# Patient Record
Sex: Male | Born: 1940 | Race: White | Hispanic: No | Marital: Married | Smoking: Never smoker
Health system: Southern US, Community
[De-identification: ages and names within clinical notes are randomized; demographics above are authoritative.]

## PROBLEM LIST (undated history)

## (undated) DIAGNOSIS — I499 Cardiac arrhythmia, unspecified: Secondary | ICD-10-CM

## (undated) DIAGNOSIS — N529 Male erectile dysfunction, unspecified: Secondary | ICD-10-CM

## (undated) DIAGNOSIS — I4891 Unspecified atrial fibrillation: Secondary | ICD-10-CM

## (undated) DIAGNOSIS — M169 Osteoarthritis of hip, unspecified: Secondary | ICD-10-CM

## (undated) DIAGNOSIS — I10 Essential (primary) hypertension: Secondary | ICD-10-CM

## (undated) HISTORY — PX: OTHER SURGICAL HISTORY: SHX169

## (undated) HISTORY — DX: Essential (primary) hypertension: I10

## (undated) HISTORY — DX: Unspecified atrial fibrillation: I48.91

## (undated) HISTORY — DX: Osteoarthritis of hip, unspecified: M16.9

## (undated) HISTORY — PX: VASECTOMY: SHX75

## (undated) HISTORY — PX: KNEE SURGERY: SHX244

## (undated) HISTORY — DX: Male erectile dysfunction, unspecified: N52.9

---

## 2004-04-16 HISTORY — PX: COLONOSCOPY: SHX174

## 2011-04-23 DIAGNOSIS — H5210 Myopia, unspecified eye: Secondary | ICD-10-CM | POA: Diagnosis not present

## 2011-04-23 DIAGNOSIS — H25049 Posterior subcapsular polar age-related cataract, unspecified eye: Secondary | ICD-10-CM | POA: Diagnosis not present

## 2011-08-28 DIAGNOSIS — Z1211 Encounter for screening for malignant neoplasm of colon: Secondary | ICD-10-CM | POA: Diagnosis not present

## 2011-08-28 DIAGNOSIS — M25559 Pain in unspecified hip: Secondary | ICD-10-CM | POA: Diagnosis not present

## 2011-08-28 DIAGNOSIS — I10 Essential (primary) hypertension: Secondary | ICD-10-CM | POA: Diagnosis not present

## 2011-08-28 DIAGNOSIS — Z79899 Other long term (current) drug therapy: Secondary | ICD-10-CM | POA: Diagnosis not present

## 2011-08-31 DIAGNOSIS — Z1211 Encounter for screening for malignant neoplasm of colon: Secondary | ICD-10-CM | POA: Diagnosis not present

## 2011-09-20 DIAGNOSIS — M169 Osteoarthritis of hip, unspecified: Secondary | ICD-10-CM | POA: Diagnosis not present

## 2012-04-22 DIAGNOSIS — J40 Bronchitis, not specified as acute or chronic: Secondary | ICD-10-CM | POA: Diagnosis not present

## 2012-06-09 DIAGNOSIS — H25019 Cortical age-related cataract, unspecified eye: Secondary | ICD-10-CM | POA: Diagnosis not present

## 2012-08-27 DIAGNOSIS — Z23 Encounter for immunization: Secondary | ICD-10-CM | POA: Diagnosis not present

## 2012-08-27 DIAGNOSIS — Z1211 Encounter for screening for malignant neoplasm of colon: Secondary | ICD-10-CM | POA: Diagnosis not present

## 2012-08-27 DIAGNOSIS — I10 Essential (primary) hypertension: Secondary | ICD-10-CM | POA: Diagnosis not present

## 2012-08-27 DIAGNOSIS — M169 Osteoarthritis of hip, unspecified: Secondary | ICD-10-CM | POA: Diagnosis not present

## 2012-08-27 DIAGNOSIS — Z79899 Other long term (current) drug therapy: Secondary | ICD-10-CM | POA: Diagnosis not present

## 2012-10-09 DIAGNOSIS — Z1211 Encounter for screening for malignant neoplasm of colon: Secondary | ICD-10-CM | POA: Diagnosis not present

## 2012-12-09 DIAGNOSIS — L819 Disorder of pigmentation, unspecified: Secondary | ICD-10-CM | POA: Diagnosis not present

## 2012-12-09 DIAGNOSIS — Z85828 Personal history of other malignant neoplasm of skin: Secondary | ICD-10-CM | POA: Diagnosis not present

## 2012-12-09 DIAGNOSIS — L821 Other seborrheic keratosis: Secondary | ICD-10-CM | POA: Diagnosis not present

## 2012-12-09 DIAGNOSIS — D485 Neoplasm of uncertain behavior of skin: Secondary | ICD-10-CM | POA: Diagnosis not present

## 2012-12-09 DIAGNOSIS — D239 Other benign neoplasm of skin, unspecified: Secondary | ICD-10-CM | POA: Diagnosis not present

## 2012-12-09 DIAGNOSIS — D1801 Hemangioma of skin and subcutaneous tissue: Secondary | ICD-10-CM | POA: Diagnosis not present

## 2012-12-10 DIAGNOSIS — L570 Actinic keratosis: Secondary | ICD-10-CM | POA: Diagnosis not present

## 2013-01-06 DIAGNOSIS — L570 Actinic keratosis: Secondary | ICD-10-CM | POA: Diagnosis not present

## 2013-04-13 DIAGNOSIS — H1130 Conjunctival hemorrhage, unspecified eye: Secondary | ICD-10-CM | POA: Diagnosis not present

## 2013-06-16 DIAGNOSIS — H2510 Age-related nuclear cataract, unspecified eye: Secondary | ICD-10-CM | POA: Diagnosis not present

## 2013-06-16 DIAGNOSIS — H25049 Posterior subcapsular polar age-related cataract, unspecified eye: Secondary | ICD-10-CM | POA: Diagnosis not present

## 2013-06-16 DIAGNOSIS — H5210 Myopia, unspecified eye: Secondary | ICD-10-CM | POA: Diagnosis not present

## 2013-08-27 DIAGNOSIS — Z1211 Encounter for screening for malignant neoplasm of colon: Secondary | ICD-10-CM | POA: Diagnosis not present

## 2013-08-27 DIAGNOSIS — M1610 Unilateral primary osteoarthritis, unspecified hip: Secondary | ICD-10-CM | POA: Diagnosis not present

## 2013-08-27 DIAGNOSIS — M169 Osteoarthritis of hip, unspecified: Secondary | ICD-10-CM | POA: Diagnosis not present

## 2013-08-27 DIAGNOSIS — I10 Essential (primary) hypertension: Secondary | ICD-10-CM | POA: Diagnosis not present

## 2013-09-15 DIAGNOSIS — Z1211 Encounter for screening for malignant neoplasm of colon: Secondary | ICD-10-CM | POA: Diagnosis not present

## 2013-10-28 DIAGNOSIS — R300 Dysuria: Secondary | ICD-10-CM | POA: Diagnosis not present

## 2013-10-28 DIAGNOSIS — R319 Hematuria, unspecified: Secondary | ICD-10-CM | POA: Diagnosis not present

## 2013-10-28 DIAGNOSIS — N419 Inflammatory disease of prostate, unspecified: Secondary | ICD-10-CM | POA: Diagnosis not present

## 2013-11-05 DIAGNOSIS — M7750 Other enthesopathy of unspecified foot: Secondary | ICD-10-CM | POA: Diagnosis not present

## 2013-11-05 DIAGNOSIS — M1710 Unilateral primary osteoarthritis, unspecified knee: Secondary | ICD-10-CM | POA: Diagnosis not present

## 2013-11-06 DIAGNOSIS — M25579 Pain in unspecified ankle and joints of unspecified foot: Secondary | ICD-10-CM | POA: Diagnosis not present

## 2013-11-16 DIAGNOSIS — M25579 Pain in unspecified ankle and joints of unspecified foot: Secondary | ICD-10-CM | POA: Diagnosis not present

## 2013-11-19 DIAGNOSIS — M25579 Pain in unspecified ankle and joints of unspecified foot: Secondary | ICD-10-CM | POA: Diagnosis not present

## 2013-11-30 DIAGNOSIS — M25579 Pain in unspecified ankle and joints of unspecified foot: Secondary | ICD-10-CM | POA: Diagnosis not present

## 2013-12-11 DIAGNOSIS — M25579 Pain in unspecified ankle and joints of unspecified foot: Secondary | ICD-10-CM | POA: Diagnosis not present

## 2013-12-14 DIAGNOSIS — M25579 Pain in unspecified ankle and joints of unspecified foot: Secondary | ICD-10-CM | POA: Diagnosis not present

## 2013-12-17 DIAGNOSIS — M25579 Pain in unspecified ankle and joints of unspecified foot: Secondary | ICD-10-CM | POA: Diagnosis not present

## 2013-12-18 DIAGNOSIS — Z85828 Personal history of other malignant neoplasm of skin: Secondary | ICD-10-CM | POA: Diagnosis not present

## 2013-12-18 DIAGNOSIS — D1801 Hemangioma of skin and subcutaneous tissue: Secondary | ICD-10-CM | POA: Diagnosis not present

## 2013-12-18 DIAGNOSIS — L819 Disorder of pigmentation, unspecified: Secondary | ICD-10-CM | POA: Diagnosis not present

## 2013-12-18 DIAGNOSIS — L570 Actinic keratosis: Secondary | ICD-10-CM | POA: Diagnosis not present

## 2013-12-18 DIAGNOSIS — L821 Other seborrheic keratosis: Secondary | ICD-10-CM | POA: Diagnosis not present

## 2013-12-18 DIAGNOSIS — L84 Corns and callosities: Secondary | ICD-10-CM | POA: Diagnosis not present

## 2013-12-18 DIAGNOSIS — D239 Other benign neoplasm of skin, unspecified: Secondary | ICD-10-CM | POA: Diagnosis not present

## 2013-12-22 DIAGNOSIS — M25579 Pain in unspecified ankle and joints of unspecified foot: Secondary | ICD-10-CM | POA: Diagnosis not present

## 2014-02-02 DIAGNOSIS — Z23 Encounter for immunization: Secondary | ICD-10-CM | POA: Diagnosis not present

## 2014-05-19 DIAGNOSIS — D1030 Benign neoplasm of unspecified part of mouth: Secondary | ICD-10-CM | POA: Diagnosis not present

## 2014-05-26 DIAGNOSIS — K1321 Leukoplakia of oral mucosa, including tongue: Secondary | ICD-10-CM | POA: Diagnosis not present

## 2014-06-21 DIAGNOSIS — H2513 Age-related nuclear cataract, bilateral: Secondary | ICD-10-CM | POA: Diagnosis not present

## 2014-06-21 DIAGNOSIS — H5213 Myopia, bilateral: Secondary | ICD-10-CM | POA: Diagnosis not present

## 2014-06-28 DIAGNOSIS — R3915 Urgency of urination: Secondary | ICD-10-CM | POA: Diagnosis not present

## 2014-07-06 DIAGNOSIS — R350 Frequency of micturition: Secondary | ICD-10-CM | POA: Diagnosis not present

## 2014-07-06 DIAGNOSIS — N419 Inflammatory disease of prostate, unspecified: Secondary | ICD-10-CM | POA: Diagnosis not present

## 2014-08-31 DIAGNOSIS — I10 Essential (primary) hypertension: Secondary | ICD-10-CM | POA: Diagnosis not present

## 2014-08-31 DIAGNOSIS — M169 Osteoarthritis of hip, unspecified: Secondary | ICD-10-CM | POA: Diagnosis not present

## 2014-08-31 DIAGNOSIS — N529 Male erectile dysfunction, unspecified: Secondary | ICD-10-CM | POA: Diagnosis not present

## 2014-08-31 DIAGNOSIS — Z23 Encounter for immunization: Secondary | ICD-10-CM | POA: Diagnosis not present

## 2014-08-31 DIAGNOSIS — Z1389 Encounter for screening for other disorder: Secondary | ICD-10-CM | POA: Diagnosis not present

## 2014-12-21 DIAGNOSIS — D1800 Hemangioma unspecified site: Secondary | ICD-10-CM | POA: Diagnosis not present

## 2014-12-21 DIAGNOSIS — L570 Actinic keratosis: Secondary | ICD-10-CM | POA: Diagnosis not present

## 2014-12-21 DIAGNOSIS — L814 Other melanin hyperpigmentation: Secondary | ICD-10-CM | POA: Diagnosis not present

## 2014-12-21 DIAGNOSIS — Z85828 Personal history of other malignant neoplasm of skin: Secondary | ICD-10-CM | POA: Diagnosis not present

## 2014-12-21 DIAGNOSIS — L821 Other seborrheic keratosis: Secondary | ICD-10-CM | POA: Diagnosis not present

## 2014-12-21 DIAGNOSIS — D225 Melanocytic nevi of trunk: Secondary | ICD-10-CM | POA: Diagnosis not present

## 2015-01-05 DIAGNOSIS — M1611 Unilateral primary osteoarthritis, right hip: Secondary | ICD-10-CM | POA: Diagnosis not present

## 2015-01-25 DIAGNOSIS — Z23 Encounter for immunization: Secondary | ICD-10-CM | POA: Diagnosis not present

## 2015-06-28 DIAGNOSIS — H25013 Cortical age-related cataract, bilateral: Secondary | ICD-10-CM | POA: Diagnosis not present

## 2015-06-28 DIAGNOSIS — H5213 Myopia, bilateral: Secondary | ICD-10-CM | POA: Diagnosis not present

## 2015-07-01 DIAGNOSIS — Z1211 Encounter for screening for malignant neoplasm of colon: Secondary | ICD-10-CM | POA: Diagnosis not present

## 2015-08-15 DIAGNOSIS — I4891 Unspecified atrial fibrillation: Secondary | ICD-10-CM | POA: Insufficient documentation

## 2015-08-15 HISTORY — DX: Unspecified atrial fibrillation: I48.91

## 2015-08-31 DIAGNOSIS — N529 Male erectile dysfunction, unspecified: Secondary | ICD-10-CM | POA: Diagnosis not present

## 2015-08-31 DIAGNOSIS — M169 Osteoarthritis of hip, unspecified: Secondary | ICD-10-CM | POA: Diagnosis not present

## 2015-08-31 DIAGNOSIS — I4891 Unspecified atrial fibrillation: Secondary | ICD-10-CM | POA: Diagnosis not present

## 2015-08-31 DIAGNOSIS — Z1389 Encounter for screening for other disorder: Secondary | ICD-10-CM | POA: Diagnosis not present

## 2015-08-31 DIAGNOSIS — R9431 Abnormal electrocardiogram [ECG] [EKG]: Secondary | ICD-10-CM | POA: Diagnosis not present

## 2015-08-31 DIAGNOSIS — I10 Essential (primary) hypertension: Secondary | ICD-10-CM | POA: Diagnosis not present

## 2015-09-01 DIAGNOSIS — N529 Male erectile dysfunction, unspecified: Secondary | ICD-10-CM | POA: Insufficient documentation

## 2015-09-01 DIAGNOSIS — M169 Osteoarthritis of hip, unspecified: Secondary | ICD-10-CM | POA: Insufficient documentation

## 2015-09-08 ENCOUNTER — Encounter: Payer: Self-pay | Admitting: Cardiology

## 2015-09-13 ENCOUNTER — Ambulatory Visit (INDEPENDENT_AMBULATORY_CARE_PROVIDER_SITE_OTHER): Payer: Medicare Other | Admitting: Cardiology

## 2015-09-13 ENCOUNTER — Encounter: Payer: Self-pay | Admitting: Cardiology

## 2015-09-13 VITALS — BP 144/88 | HR 84 | Ht 74.0 in | Wt 202.2 lb

## 2015-09-13 DIAGNOSIS — I4891 Unspecified atrial fibrillation: Secondary | ICD-10-CM | POA: Diagnosis not present

## 2015-09-13 DIAGNOSIS — I481 Persistent atrial fibrillation: Secondary | ICD-10-CM | POA: Diagnosis not present

## 2015-09-13 DIAGNOSIS — I4819 Other persistent atrial fibrillation: Secondary | ICD-10-CM

## 2015-09-13 NOTE — Progress Notes (Signed)
Electrophysiology Office Note   Date:  09/13/2015   ID:  DAVINCI Roberson, DOB [birth year], MRN [id number]  PCP:  Simona Huh, MD  Primary Electrophysiologist:  Constance Haw, MD    Chief Complaint  Patient presents with  . Advice Only  . Atrial Fibrillation     History of Present Illness: Bradley Roberson is a 75 y.o. male who presents today for electrophysiology evaluation.   He has a history of atrial fibrillation and hypertension. He was recently diagnosed with atrial fibrillation. He was started on rivaroxaban by his primary care physician. He says that he was getting a colonoscopy in March when he was noted to be in atrial fibrillation.he was recently seen by his primary physician was put on rivaroxaban 2 weeks ago. He has no chest pain, shortness of breath, or fatigue. He says that he is able to exercise without any issues.   Today, he denies symptoms of palpitations, chest pain, shortness of breath, orthopnea, PND, lower extremity edema, claudication, dizziness, presyncope, syncope, bleeding, or neurologic sequela. The patient is tolerating medications without difficulties and is otherwise without complaint today.    Past Medical History  Diagnosis Date  . Hypertension   . 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 Erectile dysfunction   . Atrial fibrillation ([place]) 08/2015   Past Surgical History  Procedure Laterality Date  . Right anterior cruciate ligament tear    . Colonoscopy  2006  . Knee surgery      x 5  . Vasectomy       Current Outpatient Prescriptions  Medication Sig Dispense Refill  . meloxicam (MOBIC) 7.5 MG tablet Take 7.5 mg by mouth daily.    . quinapril (ACCUPRIL) 10 MG tablet Take 10 mg by mouth daily.    . rivaroxaban (XARELTO) 20 MG TABS tablet Take 20 mg by mouth daily with supper.    . sildenafil (REVATIO) 20 MG tablet Take 20 mg by mouth as needed (sexual activity).     No current facility-administered medications for  this visit.    Allergies:   Review of patient's allergies indicates no known allergies.   Social History:  The patient  reports that he has never smoked. He does not have any smokeless tobacco history on file. He reports that he drinks about 1.8 oz of alcohol per week. He reports that he does not use illicit drugs.   Family History:  The patient's family history includes Cirrhosis in his father; Hemochromatosis in his father; Hypertension in his mother; Liver cancer in his father.    ROS:  Please see the history of present illness.   Otherwise, review of systems is positive for palpitations.   All other systems are reviewed and negative.    PHYSICAL EXAM: VS:  BP 144/88 mmHg  Pulse 84  Ht 6\' 2"  (1.88 m)  Wt 202 lb 3.2 oz (91.717 kg)  BMI 25.95 kg/m2 , BMI Body mass index is 25.95 kg/(m^2). GEN: Well nourished, well developed, in no acute distress HEENT: normal Neck: no JVD, carotid bruits, or masses Cardiac: RRR; no murmurs, rubs, or gallops,no edema  Respiratory:  clear to auscultation bilaterally, normal work of breathing GI: soft, nontender, nondistended, + BS MS: no deformity or atrophy Skin: warm and dry Neuro:  Strength and sensation are intact Psych: euthymic mood, full affect  EKG:  EKG is ordered today. The ekg ordered today shows atrial fibrillation, LVH by voltage, QTc 463  Recent  Labs: No results found for requested labs within last 365 days.    Lipid Panel  No results found for: CHOL, TRIG, HDL, CHOLHDL, VLDL, LDLCALC, LDLDIRECT   Wt Readings from Last 3 Encounters:  09/13/15 202 lb 3.2 oz (91.717 kg)      Other studies Reviewed: Additional studies/ records that were reviewed today include: PCP notes   ASSESSMENT AND PLAN:  1.  Persistent atrial fibrillation: was placed on rivaroxaban by his primary physician. Feels well without any major symptoms. He has not had an echocardiogram, nor TSH, so we will order those today. I did discuss with him the  option of going back to normal rhythm. I discussed cardioversion, medications, and the potential for ablation down the road. We will get the TSH and the echocardiogram, and see him back in a month for further discussions on resumption of sinus rhythm.  This patients CHA2DS2-VASc Score and unadjusted Ischemic Stroke Rate (% per year) is equal to 2.2 % stroke rate/year from a score of 2  Above score calculated as 1 point each if present [CHF, HTN, DM, Vascular=MI/PAD/Aortic Plaque, Age if 65-74, or Male] Above score calculated as 2 points each if present [Age > 75, or Stroke/TIA/TE]  2. Hypertension: blood pressure today is mildly elevated. He will check his blood pressure at home and give [place] readings when he returns to clinic.  Current medicines are reviewed at length with the patient today.   The patient does not have concerns regarding his medicines.  The following changes were made today:  none  Labs/ tests ordered today include:  No orders of the defined types were placed in this encounter.     Disposition:   FU with Will Camnitz 1 month   Signed, Will Meredith Leeds, MD  09/13/2015 2:49 PM     [facility] [address] [place]  [address] [phone] (office) [phone] (fax)

## 2015-09-13 NOTE — Patient Instructions (Addendum)
Medication Instructions:  Your physician recommends that you continue on your current medications as directed. Please refer to the Current Medication list given to you today.  Labwork: TSH today  Testing/Procedures: Your physician has requested that you have an echocardiogram. Echocardiography is a painless test that uses sound waves to create images of your heart. It provides your doctor with information about the size and shape of your heart and how well your heart's chambers and valves are working. This procedure takes approximately one hour. There are no restrictions for this procedure.  Follow-Up: Your physician recommends that you schedule a follow-up appointment in: 1 month with Dr. Curt Bears  If you need a refill on your cardiac medications before your next appointment, please call your pharmacy.  Thank you for choosing [facility]!!   Trinidad Curet, RN [phone]  Any Other Special Instructions Will Be Listed Below (If Applicable). Please call the office if blood pressures are extremely high.  Otherwise keep track of your blood pressures and bring readings to next office visit.

## 2015-09-14 ENCOUNTER — Encounter: Payer: Self-pay | Admitting: Cardiology

## 2015-09-14 LAB — TSH: TSH: 1.05 m[IU]/L (ref 0.40–4.50)

## 2015-09-28 ENCOUNTER — Other Ambulatory Visit: Payer: Self-pay

## 2015-09-28 ENCOUNTER — Ambulatory Visit (HOSPITAL_COMMUNITY): Payer: Medicare Other | Attending: Cardiovascular Disease

## 2015-09-28 DIAGNOSIS — I481 Persistent atrial fibrillation: Secondary | ICD-10-CM | POA: Diagnosis not present

## 2015-09-28 DIAGNOSIS — I517 Cardiomegaly: Secondary | ICD-10-CM | POA: Diagnosis not present

## 2015-09-28 DIAGNOSIS — I351 Nonrheumatic aortic (valve) insufficiency: Secondary | ICD-10-CM | POA: Insufficient documentation

## 2015-09-28 DIAGNOSIS — I4891 Unspecified atrial fibrillation: Secondary | ICD-10-CM | POA: Diagnosis present

## 2015-09-28 DIAGNOSIS — I071 Rheumatic tricuspid insufficiency: Secondary | ICD-10-CM | POA: Diagnosis not present

## 2015-09-28 DIAGNOSIS — I340 Nonrheumatic mitral (valve) insufficiency: Secondary | ICD-10-CM | POA: Insufficient documentation

## 2015-09-28 DIAGNOSIS — I4819 Other persistent atrial fibrillation: Secondary | ICD-10-CM

## 2015-09-28 LAB — ECHOCARDIOGRAM COMPLETE
AOPV: 0.44 m/s
AV Area VTI index: 1.14 cm2/m2
AV Area VTI: 2.34 cm2
AV Area mean vel: 2.12 cm2
AV VEL mean LVOT/AV: 0.4
AV area mean vel ind: 0.96 cm2/m2
AV peak Index: 1.07
AVA: 2.5 cm2
AVG: 6 mmHg
AVPG: 11 mmHg
AVPKVEL: 166 cm/s
Ao-asc: 42 cm
CHL CUP AV VEL: 2.5
CHL CUP DOP CALC LVOT VTI: 14.8 cm
CHL CUP RV SYS PRESS: 34 mmHg
DOP CAL AO MEAN VELOCITY: 120 cm/s
FS: 20 % — AB (ref 28–44)
IV/PV OW: 1.2
LA ID, A-P, ES: 48 mm
LA diam end sys: 48 mm
LA diam index: 2.18 cm/m2
LAVOL: 169 mL
LAVOLA4C: 171 mL
LAVOLIN: 76.9 mL/m2
LVOT SV: 79 mL
LVOT area: 5.31 cm2
LVOT peak VTI: 0.47 cm
LVOT peak vel: 73.1 cm/s
LVOTD: 26 mm
PW: 9.32 mm — AB (ref 0.6–1.1)
Reg peak vel: 254 cm/s
TRMAXVEL: 254 cm/s
VTI: 31.4 cm
Valve area index: 1.14

## 2015-10-16 NOTE — Progress Notes (Signed)
Electrophysiology Office Note   Date:  10/17/2015   ID:  Bradley Roberson, DOB 01-10-1941, MRN [id number]  PCP:  Simona Huh, MD  Primary Electrophysiologist:  Constance Haw, MD    Chief Complaint  Patient presents with  . Follow-up    1 month  . PAF     History of Present Illness: Bradley Roberson is a 75 y.o. male who presents today for electrophysiology evaluation.   He has a history of atrial fibrillation and hypertension. He was recently diagnosed with atrial fibrillation. He was started on rivaroxaban by his primary care physician. He says that he was getting a colonoscopy in March when he was noted to be in atrial fibrillation.   Today, he denies symptoms of palpitations, chest pain, shortness of breath, orthopnea, PND, lower extremity edema, claudication, dizziness, presyncope, syncope, bleeding, or neurologic sequela. The patient is tolerating medications without difficulties and is otherwise without complaint today. He has not had any changes in exercise tolerance.   Past Medical History  Diagnosis Date  . Hypertension   . 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 Erectile dysfunction   . Atrial fibrillation ([place]) 08/2015   Past Surgical History  Procedure Laterality Date  . Right anterior cruciate ligament tear    . Colonoscopy  2006  . Knee surgery      x 5  . Vasectomy       Current Outpatient Prescriptions  Medication Sig Dispense Refill  . meloxicam (MOBIC) 7.5 MG tablet Take 7.5 mg by mouth daily.    . quinapril (ACCUPRIL) 10 MG tablet Take 10 mg by mouth daily.    . rivaroxaban (XARELTO) 20 MG TABS tablet Take 20 mg by mouth daily with supper.    . sildenafil (REVATIO) 20 MG tablet Take 20 mg by mouth as needed (sexual activity).     No current facility-administered medications for this visit.    Allergies:   Review of patient's allergies indicates no known allergies.   Social History:  The patient  reports that he has never  smoked. He does not have any smokeless tobacco history on file. He reports that he drinks about 1.8 oz of alcohol per week. He reports that he does not use illicit drugs.   Family History:  The patient's family history includes Cirrhosis in his father; Hemochromatosis in his father; Hypertension in his mother; Liver cancer in his father.    ROS:  Please see the history of present illness.   Otherwise, review of systems is positive for none.   All other systems are reviewed and negative.    PHYSICAL EXAM: VS:  BP 162/98 mmHg  Pulse 73  Ht 6\' 2"  (1.88 m)  Wt 201 lb 9.6 oz (91.445 kg)  BMI 25.87 kg/m2 , BMI Body mass index is 25.87 kg/(m^2). GEN: Well nourished, well developed, in no acute distress HEENT: normal Neck: no JVD, carotid bruits, or masses Cardiac: irregular; no murmurs, rubs, or gallops,no edema  Respiratory:  clear to auscultation bilaterally, normal work of breathing GI: soft, nontender, nondistended, + BS MS: no deformity or atrophy Skin: warm and dry Neuro:  Strength and sensation are intact Psych: euthymic mood, full affect  EKG:  EKG is ordered today. The ekg ordered today shows atrial fibrillation, LVH by voltage, QTc 434  Recent Labs: 09/13/2015: TSH 1.05    Lipid Panel  No results found for: CHOL, TRIG, HDL, CHOLHDL, VLDL, LDLCALC, LDLDIRECT   Wt  Readings from Last 3 Encounters:  10/17/15 201 lb 9.6 oz (91.445 kg)  09/13/15 202 lb 3.2 oz (91.717 kg)      Other studies Reviewed: Additional studies/ records that were reviewed today include: TTE 09/28/15 - Left ventricle: The cavity size was severely dilated. There was  mild focal basal hypertrophy of the septum. Systolic function was  mildly reduced. The estimated ejection fraction was in the range  of 45% to 50%. Diffuse hypokinesis. - Aortic valve: Trileaflet; moderately thickened, moderately  calcified leaflets. There was mild regurgitation. - Mitral valve: Calcified annulus. There was trivial  regurgitation. - Left atrium: The appendage was severely dilated. - Right ventricle: The cavity size was mildly dilated. Wall  thickness was normal. Systolic function was normal. - Right atrium: The appendage was severely dilated. - Atrial septum: No defect or patent foramen ovale was identified  by color flow Doppler. - Tricuspid valve: There was mild regurgitation. - Pulmonary arteries: Systolic pressure was within the normal  range. PA peak pressure: 34 mm Hg (S). - Inferior vena cava: The vessel was dilated. The respirophasic  diameter changes were in the normal range (>= 50%), consistent  with elevated central venous pressure.  Impressions:  - Systolic function may be underestimated due to atrial  fibrillation.   ASSESSMENT AND PLAN:  1.  Persistent atrial fibrillation: was placed on rivaroxaban by his primary physician.Currently, he does not have many symptoms from atrial fibrillation.  His antiarrhythmic options are limited due to dilated LV with mildly reduced LVEF. Options are amiodarone or Tikosyn.  Belinda Schlichting further discuss with the patient over the phone to find the best medical regimen for his AF.  May choose to remain in AF as currently asymptomatic at this time.  This patients CHA2DS2-VASc Score and unadjusted Ischemic Stroke Rate (% per year) is equal to 2.2 % stroke rate/year from a score of 2  Above score calculated as 1 point each if present [CHF, HTN, DM, Vascular=MI/PAD/Aortic Plaque, Age if 65-74, or Male] Above score calculated as 2 points each if present [Age > 75, or Stroke/TIA/TE]  2. Hypertension: blood pressure today is mildly elevated. He Katelinn Justice check his blood pressure at home and give [place] readings when he returns to clinic.  Current medicines are reviewed at length with the patient today.   The patient does not have concerns regarding his medicines.  The following changes were made today:  none  Labs/ tests ordered today include:  No orders of the  defined types were placed in this encounter.     Disposition:   FU with Quetzalli Clos 1 month   Signed, Ashlin Hidalgo Meredith Leeds, MD  10/17/2015 11:36 AM     [facility] [address] [place] [address] [phone] (office) [phone] (fax)

## 2015-10-17 ENCOUNTER — Ambulatory Visit (INDEPENDENT_AMBULATORY_CARE_PROVIDER_SITE_OTHER): Payer: Medicare Other | Admitting: Cardiology

## 2015-10-17 ENCOUNTER — Encounter: Payer: Self-pay | Admitting: Cardiology

## 2015-10-17 VITALS — BP 162/98 | HR 73 | Ht 74.0 in | Wt 201.6 lb

## 2015-10-17 DIAGNOSIS — I481 Persistent atrial fibrillation: Secondary | ICD-10-CM | POA: Diagnosis not present

## 2015-10-17 DIAGNOSIS — I4819 Other persistent atrial fibrillation: Secondary | ICD-10-CM

## 2015-10-17 NOTE — Patient Instructions (Signed)
Medication Instructions:  Your physician recommends that you continue on your current medications as directed. Please refer to the Current Medication list given to you today.  Labwork: None ordered  Testing/Procedures: None ordered  Follow-Up: We will call you with plan of care once Dr. Curt Bears has taken a closer look at echocardiogram results and your left atrium.  If you need a refill on your cardiac medications before your next appointment, please call your pharmacy.  Thank you for choosing [facility]!!   Trinidad Curet, RN [phone]

## 2015-10-21 ENCOUNTER — Telehealth: Payer: Self-pay

## 2015-10-21 NOTE — Telephone Encounter (Signed)
Letter received from [employer]. They have denied help with his Xarelto. Hopefully patient has received this same letter.

## 2015-10-24 ENCOUNTER — Telehealth: Payer: Self-pay | Admitting: *Deleted

## 2015-10-24 NOTE — Telephone Encounter (Signed)
Spoke with patient informing him of 3 choices he has for AFib:  1) Stay in AFib w/ rate control     2)  DCCV       3) Drugs  Patient would like OV with Dr. Curt Bears to discuss in more detail w/ pt and pt's wife. Appt made for 7/26. Patient thanks me for helping.

## 2015-11-08 NOTE — Progress Notes (Signed)
Electrophysiology Office Note   Date:  11/09/2015   ID:  LAYN PLETT, DOB 07-18-1940, MRN [id number]  PCP:  Simona Huh, MD  Primary Electrophysiologist:  Constance Haw, MD    Chief Complaint  Patient presents with  . Follow-up  . Atrial Fibrillation     History of Present Illness: Bradley Roberson is a 75 y.o. male who presents today for electrophysiology evaluation.   He has a history of atrial fibrillation and hypertension. He was recently diagnosed with atrial fibrillation. He was started on rivaroxaban by his primary care physician. He says that he was getting a colonoscopy in March when he was noted to be in atrial fibrillation.    Today, he denies symptoms of palpitations, chest pain, shortness of breath, orthopnea, PND, lower extremity edema, claudication, dizziness, presyncope, syncope, bleeding, or neurologic sequela. The patient is tolerating medications without difficulties and is otherwise without complaint today.  He does say that he has been napping more often, but is still able to exercise without issue.  Past Medical History:  Diagnosis Date  . Atrial fibrillation ([place]) 08/2015  . 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 Erectile dysfunction   . Hypertension    Past Surgical History:  Procedure Laterality Date  . COLONOSCOPY  2006  . KNEE SURGERY     x 5  . right anterior cruciate ligament tear    . VASECTOMY       Current Outpatient Prescriptions  Medication Sig Dispense Refill  . meloxicam (MOBIC) 7.5 MG tablet Take 7.5 mg by mouth daily.    . quinapril (ACCUPRIL) 10 MG tablet Take 10 mg by mouth daily.    . rivaroxaban (XARELTO) 20 MG TABS tablet Take 20 mg by mouth daily with supper.    . sildenafil (REVATIO) 20 MG tablet Take 20 mg by mouth as needed (sexual activity).     No current facility-administered medications for this visit.     Allergies:   Review of patient's allergies indicates no known allergies.    Social History:  The patient  reports that he has never smoked. He does not have any smokeless tobacco history on file. He reports that he drinks about 1.8 oz of alcohol per week . He reports that he does not use drugs.   Family History:  The patient's family history includes Cirrhosis in his father; Hemochromatosis in his father; Hypertension in his mother; Liver cancer in his father.    ROS:  Please see the history of present illness.   Otherwise, review of systems is positive for none.   All other systems are reviewed and negative.    PHYSICAL EXAM: VS:  BP (!) 166/110   Pulse 70   Ht 6\' 2"  (1.88 m)   Wt 199 lb 12.8 oz (90.6 kg)   BMI 25.65 kg/m  , BMI Body mass index is 25.65 kg/m. GEN: Well nourished, well developed, in no acute distress  HEENT: normal  Neck: no JVD, carotid bruits, or masses Cardiac: irregular; no murmurs, rubs, or gallops,no edema  Respiratory:  clear to auscultation bilaterally, normal work of breathing GI: soft, nontender, nondistended, + BS MS: no deformity or atrophy  Skin: warm and dry Neuro:  Strength and sensation are intact Psych: euthymic mood, full affect  EKG:  EKG is not ordered today.  Recent Labs: 09/13/2015: TSH 1.05    Lipid Panel  No results found for: CHOL, TRIG, HDL, CHOLHDL, VLDL, LDLCALC, LDLDIRECT  Wt Readings from Last 3 Encounters:  11/09/15 199 lb 12.8 oz (90.6 kg)  10/17/15 201 lb 9.6 oz (91.4 kg)  09/13/15 202 lb 3.2 oz (91.7 kg)      Other studies Reviewed: Additional studies/ records that were reviewed today include: TTE 09/28/15 - Left ventricle: The cavity size was severely dilated. There was  mild focal basal hypertrophy of the septum. Systolic function was  mildly reduced. The estimated ejection fraction was in the range  of 45% to 50%. Diffuse hypokinesis. - Aortic valve: Trileaflet; moderately thickened, moderately  calcified leaflets. There was mild regurgitation. - Mitral valve: Calcified annulus.  There was trivial regurgitation. - Left atrium: The appendage was severely dilated. - Right ventricle: The cavity size was mildly dilated. Wall  thickness was normal. Systolic function was normal. - Right atrium: The appendage was severely dilated. - Atrial septum: No defect or patent foramen ovale was identified  by color flow Doppler. - Tricuspid valve: There was mild regurgitation. - Pulmonary arteries: Systolic pressure was within the normal  range. PA peak pressure: 34 mm Hg (S). - Inferior vena cava: The vessel was dilated. The respirophasic  diameter changes were in the normal range (>= 50%), consistent  with elevated central venous pressure.  Impressions:  - Systolic function may be underestimated due to atrial  fibrillation.   ASSESSMENT AND PLAN:  1.  Persistent atrial fibrillation: was placed on rivaroxaban by his primary physician.  It is currently unclear whether or not he is having symptoms, though he is having significant fatigue. We'll plan for cardioversion. This will help [place] further determine if he is having symptoms from his atrial fibrillation, and with her not he would benefit from medical management.  This patients CHA2DS2-VASc Score and unadjusted Ischemic Stroke Rate (% per year) is equal to 2.2 % stroke rate/year from a score of 2  Above score calculated as 1 point each if present [CHF, HTN, DM, Vascular=MI/PAD/Aortic Plaque, Age if 65-74, or Male] Above score calculated as 2 points each if present [Age > 75, or Stroke/TIA/TE]  2. Hypertension: blood pressure today is  elevated today. We'll increase his quinapril to 20 mg, and start him on carvedilol 6.25 twice daily.  Current medicines are reviewed at length with the patient today.   The patient does not have concerns regarding his medicines.  The following changes were made today:increase quinapril, start carvedilol  Labs/ tests ordered today include:  No orders of the defined types were placed  in this encounter.    Disposition:   FU with Will Camnitz 3 months   Signed, Will Meredith Leeds, MD  11/09/2015 11:19 AM     [facility] [address] [place] [address] [phone] (office) [phone] (fax)

## 2015-11-09 ENCOUNTER — Ambulatory Visit (INDEPENDENT_AMBULATORY_CARE_PROVIDER_SITE_OTHER): Payer: Medicare Other | Admitting: Cardiology

## 2015-11-09 ENCOUNTER — Encounter: Payer: Self-pay | Admitting: Cardiology

## 2015-11-09 ENCOUNTER — Encounter: Payer: Self-pay | Admitting: *Deleted

## 2015-11-09 ENCOUNTER — Other Ambulatory Visit: Payer: Self-pay

## 2015-11-09 VITALS — BP 166/110 | HR 70 | Ht 74.0 in | Wt 199.8 lb

## 2015-11-09 DIAGNOSIS — I481 Persistent atrial fibrillation: Secondary | ICD-10-CM

## 2015-11-09 DIAGNOSIS — I4819 Other persistent atrial fibrillation: Secondary | ICD-10-CM

## 2015-11-09 LAB — CBC WITH DIFFERENTIAL/PLATELET
BASOS PCT: 1 %
Basophils Absolute: 47 cells/uL (ref 0–200)
Eosinophils Absolute: 94 cells/uL (ref 15–500)
Eosinophils Relative: 2 %
HCT: 39.8 % (ref 38.5–50.0)
Hemoglobin: 13.9 g/dL (ref 13.2–17.1)
LYMPHS PCT: 22 %
Lymphs Abs: 1034 cells/uL (ref 850–3900)
MCH: 32.6 pg (ref 27.0–33.0)
MCHC: 34.9 g/dL (ref 32.0–36.0)
MCV: 93.4 fL (ref 80.0–100.0)
MONOS PCT: 7 %
MPV: 9.7 fL (ref 7.5–12.5)
Monocytes Absolute: 329 cells/uL (ref 200–950)
NEUTROS PCT: 68 %
Neutro Abs: 3196 cells/uL (ref 1500–7800)
PLATELETS: 213 10*3/uL (ref 140–400)
RBC: 4.26 MIL/uL (ref 4.20–5.80)
RDW: 13.4 % (ref 11.0–15.0)
WBC: 4.7 10*3/uL (ref 3.8–10.8)

## 2015-11-09 LAB — BASIC METABOLIC PANEL
BUN: 18 mg/dL (ref 7–25)
CO2: 26 mmol/L (ref 20–31)
CREATININE: 0.77 mg/dL (ref 0.70–1.18)
Calcium: 8.8 mg/dL (ref 8.6–10.3)
Chloride: 106 mmol/L (ref 98–110)
GLUCOSE: 95 mg/dL (ref 65–99)
Potassium: 4.4 mmol/L (ref 3.5–5.3)
Sodium: 140 mmol/L (ref 135–146)

## 2015-11-09 MED ORDER — BENAZEPRIL HCL 20 MG PO TABS: 20.0000 mg | ORAL_TABLET | Freq: Every day | ORAL | 3 refills | Status: DC

## 2015-11-09 MED ORDER — CARVEDILOL 6.25 MG PO TABS: 6.2500 mg | ORAL_TABLET | Freq: Two times a day (BID) | ORAL | 3 refills | Status: DC

## 2015-11-09 NOTE — Addendum Note (Signed)
Addended by: Stanton Kidney on: 11/09/2015 12:45 PM   Modules accepted: Orders

## 2015-11-09 NOTE — Telephone Encounter (Signed)
called and gave verbal for coreg 6.25 MG BID  [facility]  Ordered Medications    Disp Refills Start End  benazepril (LOTENSIN) 20 MG tablet 90 tablet 3 11/09/2015   Take 1 tablet (20 mg total) by mouth daily. - Oral  Notes to Pharmacy: Stopping Quinapril  carvedilol (COREG) 6.25 MG tablet 180 tablet 3 11/09/2015   Take 1 tablet (6.25 mg total) by mouth 2 (two) times daily. - Oral

## 2015-11-09 NOTE — Patient Instructions (Signed)
Medication Instructions:  Your physician has recommended you make the following change in your medication:  1) STOP Quinapril 2) START Benazepril 20 mg daily 3) START Carvedilol 6.25 mg twice daily  Labwork: Pre procedure labs today: BMET and CBC w/ diff  Testing/Procedures: Your physician has recommended that you have a Cardioversion (DCCV). Electrical Cardioversion uses a jolt of electricity to your heart either through paddles or wired patches attached to your chest. This is a controlled, usually prescheduled, procedure. Defibrillation is done under light anesthesia in the hospital, and you usually go home the day of the procedure. This is done to get your heart back into a normal rhythm. You are not awake for the procedure. Please see the instruction sheet given to you today.  Follow-Up: Your physician recommends that you schedule a follow-up appointment in: 3 months with Dr. Curt Bears.   If you need a refill on your cardiac medications before your next appointment, please call your pharmacy.  Thank you for choosing [facility]!!   Trinidad Curet, RN [phone]  Any Other Special Instructions Will Be Listed Below (If Applicable).   Electrical Cardioversion Electrical cardioversion is the delivery of a jolt of electricity to change the rhythm of the heart. Sticky patches or metal paddles are placed on the chest to deliver the electricity from a device. This is done to restore a normal rhythm. A rhythm that is too fast or not regular keeps the heart from pumping well. Electrical cardioversion is done in an emergency if:   There is low or no blood pressure as a result of the heart rhythm.   Normal rhythm must be restored as fast as possible to protect the brain and heart from further damage.   It may save a life. Cardioversion may be done for heart rhythms that are not immediately life threatening, such as atrial fibrillation or flutter, in which:   The heart is beating  too fast or is not regular.   Medicine to change the rhythm has not worked.   It is safe to wait in order to allow time for preparation.  Symptoms of the abnormal rhythm are bothersome.  The risk of stroke and other serious problems can be reduced. LET [facility] CARE PROVIDER KNOW ABOUT:   Any allergies you have.  All medicines you are taking, including vitamins, herbs, eye drops, creams, and over-the-counter medicines.  Previous problems you or members of your family have had with the use of anesthetics.   Any blood disorders you have.   Previous surgeries you have had.   Medical conditions you have. RISKS AND COMPLICATIONS  Generally, this is a safe procedure. However, problems can occur and include:   Breathing problems related to the anesthetic used.  A blood clot that breaks free and travels to other parts of your body. This could cause a stroke or other problems. The risk of this is lowered by use of blood-thinning medicine (anticoagulant) prior to the procedure.  Cardiac arrest (rare). BEFORE THE PROCEDURE   You may have tests to detect blood clots in your heart and to evaluate heart function.  You may start taking anticoagulants so your blood does not clot as easily.   Medicines may be given to help stabilize your heart rate and rhythm. PROCEDURE  You will be given medicine through an IV tube to reduce discomfort and make you sleepy (sedative).   An electrical shock will be delivered. AFTER THE PROCEDURE Your heart rhythm will be watched to make sure  it does not change.    This information is not intended to replace advice given to you by your health care provider. Make sure you discuss any questions you have with your health care provider.   Document Released: 03/23/2002 Document Revised: 04/23/2014 Document Reviewed: 10/15/2012 Elsevier Interactive Patient Education [employer].

## 2015-11-10 ENCOUNTER — Encounter (HOSPITAL_COMMUNITY): Payer: Self-pay | Admitting: *Deleted

## 2015-11-10 ENCOUNTER — Ambulatory Visit (HOSPITAL_COMMUNITY): Payer: Medicare Other | Admitting: Certified Registered Nurse Anesthetist

## 2015-11-10 ENCOUNTER — Ambulatory Visit (HOSPITAL_COMMUNITY)
Admission: RE | Admit: 2015-11-10 | Discharge: 2015-11-10 | Disposition: A | Discharge status: Home / Self Care | Payer: Medicare Other | Source: Ambulatory Visit | Attending: Cardiology | Admitting: Cardiology

## 2015-11-10 ENCOUNTER — Encounter (HOSPITAL_COMMUNITY)
Admission: RE | Disposition: A | Discharge status: Home / Self Care | Payer: Self-pay | Source: Ambulatory Visit | Attending: Cardiology

## 2015-11-10 DIAGNOSIS — I481 Persistent atrial fibrillation: Secondary | ICD-10-CM | POA: Diagnosis not present

## 2015-11-10 DIAGNOSIS — I10 Essential (primary) hypertension: Secondary | ICD-10-CM | POA: Diagnosis not present

## 2015-11-10 DIAGNOSIS — Z7901 Long term (current) use of anticoagulants: Secondary | ICD-10-CM | POA: Insufficient documentation

## 2015-11-10 DIAGNOSIS — I4891 Unspecified atrial fibrillation: Secondary | ICD-10-CM | POA: Diagnosis not present

## 2015-11-10 HISTORY — PX: CARDIOVERSION: SHX1299

## 2015-11-10 SURGERY — CARDIOVERSION
Anesthesia: General

## 2015-11-10 MED ORDER — LACTATED RINGERS IV SOLN: INTRAVENOUS | Status: DC | PRN

## 2015-11-10 MED ORDER — PROPOFOL 10 MG/ML IV BOLUS
INTRAVENOUS | Status: DC | PRN
  Administered 2015-11-10: 30 mg via INTRAVENOUS
  Administered 2015-11-10 (×3): 20 mg via INTRAVENOUS
  Administered 2015-11-10: 60 mg via INTRAVENOUS

## 2015-11-10 MED ORDER — SODIUM CHLORIDE 0.9 % IV SOLN
INTRAVENOUS | Status: DC | PRN
  Administered 2015-11-10: 14:00:00 via INTRAVENOUS

## 2015-11-10 MED ORDER — LIDOCAINE 2% (20 MG/ML) 5 ML SYRINGE
INTRAMUSCULAR | Status: DC | PRN
  Administered 2015-11-10: 40 mg via INTRAVENOUS

## 2015-11-10 NOTE — Procedures (Signed)
Electrical Cardioversion Procedure Note Bradley Roberson [id number] [birth year]  Procedure: Electrical Cardioversion Indications:  Atrial Fibrillation  Procedure Details Consent: Risks of procedure as well as the alternatives and risks of each were explained to the (patient/caregiver).  Consent for procedure obtained. Time Out: Verified patient identification, verified procedure, site/side was marked, verified correct patient position, special equipment/implants available, medications/allergies/relevent history reviewed, required imaging and test results available.  Performed  Patient placed on cardiac monitor, pulse oximetry, supplemental oxygen as necessary.  Sedation given: Propofol per anesthesiology Pacer pads placed anterior and posterior chest.  Cardioverted 3 time(s), 2nd two times using sternal pressure.  Cardioverted at [place].  Evaluation Findings: Post procedure EKG shows: Atrial Fibrillation.  He did not convert to NSR even for a few beats.  I will review with Dr Bradley Roberson who will decide on further plan.  Complications: None Patient did tolerate procedure well.   Bradley Roberson 11/10/2015, 2:18 PM

## 2015-11-10 NOTE — H&P (View-Only) (Signed)
Electrophysiology Office Note   Date:  11/09/2015   ID:  Bradley Roberson, DOB 09/05/1940, MRN [id number]  PCP:  Simona Huh, MD  Primary Electrophysiologist:  Constance Haw, MD    Chief Complaint  Patient presents with  . Follow-up  . Atrial Fibrillation     History of Present Illness: Bradley Roberson is a 75 y.o. male who presents today for electrophysiology evaluation.   He has a history of atrial fibrillation and hypertension. He was recently diagnosed with atrial fibrillation. He was started on rivaroxaban by his primary care physician. He says that he was getting a colonoscopy in March when he was noted to be in atrial fibrillation.    Today, he denies symptoms of palpitations, chest pain, shortness of breath, orthopnea, PND, lower extremity edema, claudication, dizziness, presyncope, syncope, bleeding, or neurologic sequela. The patient is tolerating medications without difficulties and is otherwise without complaint today.  He does say that he has been napping more often, but is still able to exercise without issue.  Past Medical History:  Diagnosis Date  . Atrial fibrillation ([place]) 08/2015  . 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 Erectile dysfunction   . Hypertension    Past Surgical History:  Procedure Laterality Date  . COLONOSCOPY  2006  . KNEE SURGERY     x 5  . right anterior cruciate ligament tear    . VASECTOMY       Current Outpatient Prescriptions  Medication Sig Dispense Refill  . meloxicam (MOBIC) 7.5 MG tablet Take 7.5 mg by mouth daily.    . quinapril (ACCUPRIL) 10 MG tablet Take 10 mg by mouth daily.    . rivaroxaban (XARELTO) 20 MG TABS tablet Take 20 mg by mouth daily with supper.    . sildenafil (REVATIO) 20 MG tablet Take 20 mg by mouth as needed (sexual activity).     No current facility-administered medications for this visit.     Allergies:   Review of patient's allergies indicates no known allergies.    Social History:  The patient  reports that he has never smoked. He does not have any smokeless tobacco history on file. He reports that he drinks about 1.8 oz of alcohol per week . He reports that he does not use drugs.   Family History:  The patient's family history includes Cirrhosis in his father; Hemochromatosis in his father; Hypertension in his mother; Liver cancer in his father.    ROS:  Please see the history of present illness.   Otherwise, review of systems is positive for none.   All other systems are reviewed and negative.    PHYSICAL EXAM: VS:  BP (!) 166/110   Pulse 70   Ht 6\' 2"  (1.88 m)   Wt 199 lb 12.8 oz (90.6 kg)   BMI 25.65 kg/m  , BMI Body mass index is 25.65 kg/m. GEN: Well nourished, well developed, in no acute distress  HEENT: normal  Neck: no JVD, carotid bruits, or masses Cardiac: irregular; no murmurs, rubs, or gallops,no edema  Respiratory:  clear to auscultation bilaterally, normal work of breathing GI: soft, nontender, nondistended, + BS MS: no deformity or atrophy  Skin: warm and dry Neuro:  Strength and sensation are intact Psych: euthymic mood, full affect  EKG:  EKG is not ordered today.  Recent Labs: 09/13/2015: TSH 1.05    Lipid Panel  No results found for: CHOL, TRIG, HDL, CHOLHDL, VLDL, LDLCALC, LDLDIRECT  Wt Readings from Last 3 Encounters:  11/09/15 199 lb 12.8 oz (90.6 kg)  10/17/15 201 lb 9.6 oz (91.4 kg)  09/13/15 202 lb 3.2 oz (91.7 kg)      Other studies Reviewed: Additional studies/ records that were reviewed today include: TTE 09/28/15 - Left ventricle: The cavity size was severely dilated. There was  mild focal basal hypertrophy of the septum. Systolic function was  mildly reduced. The estimated ejection fraction was in the range  of 45% to 50%. Diffuse hypokinesis. - Aortic valve: Trileaflet; moderately thickened, moderately  calcified leaflets. There was mild regurgitation. - Mitral valve: Calcified annulus.  There was trivial regurgitation. - Left atrium: The appendage was severely dilated. - Right ventricle: The cavity size was mildly dilated. Wall  thickness was normal. Systolic function was normal. - Right atrium: The appendage was severely dilated. - Atrial septum: No defect or patent foramen ovale was identified  by color flow Doppler. - Tricuspid valve: There was mild regurgitation. - Pulmonary arteries: Systolic pressure was within the normal  range. PA peak pressure: 34 mm Hg (S). - Inferior vena cava: The vessel was dilated. The respirophasic  diameter changes were in the normal range (>= 50%), consistent  with elevated central venous pressure.  Impressions:  - Systolic function may be underestimated due to atrial  fibrillation.   ASSESSMENT AND PLAN:  1.  Persistent atrial fibrillation: was placed on rivaroxaban by his primary physician.  It is currently unclear whether or not he is having symptoms, though he is having significant fatigue. We'll plan for cardioversion. This [name] help [place] further determine if he is having symptoms from his atrial fibrillation, and with her not he would benefit from medical management.  This patients CHA2DS2-VASc Score and unadjusted Ischemic Stroke Rate (% per year) is equal to 2.2 % stroke rate/year from a score of 2  Above score calculated as 1 point each if present [CHF, HTN, DM, Vascular=MI/PAD/Aortic Plaque, Age if 65-74, or Male] Above score calculated as 2 points each if present [Age > 75, or Stroke/TIA/TE]  2. Hypertension: blood pressure today is  elevated today. We'll increase his quinapril to 20 mg, and start him on carvedilol 6.25 twice daily.  Current medicines are reviewed at length with the patient today.   The patient does not have concerns regarding his medicines.  The following changes were made today:increase quinapril, start carvedilol  Labs/ tests ordered today include:  No orders of the defined types were placed  in this encounter.    Disposition:   FU with [name] 3 months   Signed, [name], MD  11/09/2015 11:19 AM     [facility] [address] [place] [address] [phone] (office) [phone] (fax)

## 2015-11-10 NOTE — Transfer of Care (Signed)
Immediate Anesthesia Transfer of Care Note  Patient: Bradley Roberson  Procedure(s) Performed: Procedure(s): CARDIOVERSION (N/A)  Patient Location: PACU and Endoscopy Unit  Anesthesia Type:General  Level of Consciousness: awake and responds to stimulation  Airway & Oxygen Therapy: Patient Spontanous Breathing and Patient connected to nasal cannula oxygen  Post-op Assessment: Report given to RN, Post -op Vital signs reviewed and stable and Patient moving all extremities X 4  Post vital signs: Reviewed and stable  Last Vitals:  Vitals:   11/10/15 1413 11/10/15 1414  BP:    Pulse: 77 70  Resp: 14 15  Temp:      Last Pain:  Vitals:   11/10/15 1326  TempSrc: Oral         Complications: No apparent anesthesia complications

## 2015-11-10 NOTE — Discharge Instructions (Signed)
Monitored Anesthesia Care °Monitored anesthesia care is an anesthesia service for a medical procedure. Anesthesia is the loss of the ability to feel pain. It is produced by medicines called anesthetics. It may affect a small area of your body (local anesthesia), a large area of your body (regional anesthesia), or your entire body (general anesthesia). The need for monitored anesthesia care depends your procedure, your condition, and the potential need for regional or general anesthesia. It is often provided during procedures where:  °· General anesthesia may be needed if there are complications. This is because you need special care when you are under general anesthesia.   °· You will be under local or regional anesthesia. This is so that you are able to have higher levels of anesthesia if needed.   °· You will receive calming medicines (sedatives). This is especially the case if sedatives are given to put you in a semi-conscious state of relaxation (deep sedation). This is because the amount of sedative needed to produce this state can be hard to predict. Too much of a sedative can produce general anesthesia. °Monitored anesthesia care is performed by one or more health care providers who have special training in all types of anesthesia. You will need to meet with these health care providers before your procedure. During this meeting, they will ask you about your medical history. They will also give you instructions to follow. (For example, you will need to stop eating and drinking before your procedure. You may also need to stop or change medicines you are taking.) During your procedure, your health care providers will stay with you. They will:  °· Watch your condition. This includes watching your blood pressure, breathing, and level of pain.   °· Diagnose and treat problems that occur.   °· Give medicines if they are needed. These may include calming medicines (sedatives) and anesthetics.   °· Make sure you are  comfortable.   °Having monitored anesthesia care does not necessarily mean that you will be under anesthesia. It does mean that your health care providers will be able to manage anesthesia if you need it or if it occurs. It also means that you will be able to have a different type of anesthesia than you are having if you need it. When your procedure is complete, your health care providers will continue to watch your condition. They will make sure any medicines wear off before you are allowed to go home.  °  °This information is not intended to replace advice given to you by your health care provider. Make sure you discuss any questions you have with your health care provider. °  °Document Released: 12/27/2004 Document Revised: 04/23/2014 Document Reviewed: 05/14/2012 °Elsevier Interactive Patient Education ©2016 [employer] °Electrical Cardioversion, Care After °Refer to this sheet in the next few weeks. These instructions provide you with information on caring for yourself after your procedure. Your health care provider may also give you more specific instructions. Your treatment has been planned according to current medical practices, but problems sometimes occur. Call your health care provider if you have any problems or questions after your procedure. °WHAT TO EXPECT AFTER THE PROCEDURE °After your procedure, it is typical to have the following sensations: °· Some redness on the skin where the shocks were delivered. If this is tender, a sunburn lotion or hydrocortisone cream may help. °· Possible return of an abnormal heart rhythm within hours or days after the procedure. °HOME CARE INSTRUCTIONS °· Take medicines only as directed by your health care provider.   Be sure you understand how and when to take your medicine. °· Learn how to feel your pulse and check it often. °· Limit your activity for 48 hours after the procedure or as directed by your health care provider. °· Avoid or minimize caffeine and other  stimulants as directed by your health care provider. °SEEK MEDICAL CARE IF: °· You feel like your heart is beating too fast or your pulse is not regular. °· You have any questions about your medicines. °· You have bleeding that will not stop. °SEEK IMMEDIATE MEDICAL CARE IF: °· You are dizzy or feel faint. °· It is hard to breathe or you feel short of breath. °· There is a change in discomfort in your chest. °· Your speech is slurred or you have trouble moving an arm or leg on one side of your body. °· You get a serious muscle cramp that does not go away. °· Your fingers or toes turn cold or blue. °  °This information is not intended to replace advice given to you by your health care provider. Make sure you discuss any questions you have with your health care provider. °  °Document Released: 01/21/2013 Document Revised: 04/23/2014 Document Reviewed: 01/21/2013 °Elsevier Interactive Patient Education ©2016 Elsevier Inc. ° °

## 2015-11-10 NOTE — Anesthesia Postprocedure Evaluation (Signed)
Anesthesia Post Note  Patient: Bradley Roberson  Procedure(s) Performed: Procedure(s) (LRB): CARDIOVERSION (N/A)  Patient location during evaluation: PACU Anesthesia Type: General Level of consciousness: awake and alert Pain management: pain level controlled Vital Signs Assessment: post-procedure vital signs reviewed and stable Respiratory status: spontaneous breathing, nonlabored ventilation, respiratory function stable and patient connected to nasal cannula oxygen Cardiovascular status: blood pressure returned to baseline and stable Postop Assessment: no signs of nausea or vomiting Anesthetic complications: no    Last Vitals:  Vitals:   11/10/15 1430 11/10/15 1440  BP: 139/81 (!) 159/88  Pulse: (!) 55 (!) 57  Resp: 13 11  Temp:      Last Pain:  Vitals:   11/10/15 1326  TempSrc: Oral                 Tiajuana Amass

## 2015-11-10 NOTE — Interval H&P Note (Signed)
History and Physical Interval Note:  11/10/2015 2:03 PM  Bradley Roberson  has presented today for surgery, with the diagnosis of afib  The various methods of treatment have been discussed with the patient and family. After consideration of risks, benefits and other options for treatment, the patient has consented to  Procedure(s): CARDIOVERSION (N/A) as a surgical intervention .  The patient's history has been reviewed, patient examined, no change in status, stable for surgery.  I have reviewed the patient's chart and labs.  Questions were answered to the patient's satisfaction.     Ayden Hardwick [employer]

## 2015-11-10 NOTE — Anesthesia Preprocedure Evaluation (Signed)
Anesthesia Evaluation  Patient identified by MRN, date of birth, ID band Patient awake    Reviewed: Allergy & Precautions, NPO status , Patient's Chart, lab work & pertinent test results  Airway Mallampati: II  TM Distance: >3 FB Neck ROM: Full    Dental   Pulmonary neg pulmonary ROS,    breath sounds clear to auscultation       Cardiovascular hypertension, + dysrhythmias Atrial Fibrillation  Rhythm:Irregular Rate:Normal  EF 45-50%   Neuro/Psych negative neurological ROS     GI/Hepatic negative GI ROS, Neg liver ROS,   Endo/Other  negative endocrine ROS  Renal/GU negative Renal ROS     Musculoskeletal  (+) Arthritis ,   Abdominal   Peds  Hematology negative hematology ROS (+)   Anesthesia Other Findings   Reproductive/Obstetrics                           Anesthesia Physical Anesthesia Plan  ASA: II  Anesthesia Plan: General   Post-op Pain Management:    Induction: Intravenous  Airway Management Planned: Mask  Additional Equipment:   Intra-op Plan:   Post-operative Plan:   Informed Consent: I have reviewed the patients History and Physical, chart, labs and discussed the procedure including the risks, benefits and alternatives for the proposed anesthesia with the patient or authorized representative who has indicated his/her understanding and acceptance.     Plan Discussed with: CRNA  Anesthesia Plan Comments:         Anesthesia Quick Evaluation

## 2015-11-11 ENCOUNTER — Encounter (HOSPITAL_COMMUNITY): Payer: Self-pay | Admitting: Cardiology

## 2015-11-15 ENCOUNTER — Telehealth: Payer: Self-pay | Admitting: *Deleted

## 2015-11-15 NOTE — Telephone Encounter (Signed)
RE: Xarelto assistance  Received: [place], LPN  Stanton Kidney, RN        Sherri,  Apparently he does not have [facility] Part D! And he must make too much $$$ for assistance. I have given the phone # for the [facility]. He is applying for new insurance in Oct. With a drug plan. He says he has plenty of samples for now.   Vaughan Basta   Previous Messages    ----- Message -----  From: Stanton Kidney, RN  Sent: 11/10/2015  1:47 PM  To: Stanton Kidney, RN, Brett Canales, LPN  Subject: Xarelto assistance                Vaughan Basta,   Can you see if there are other options for medication assistance for this patient. They have been denied through [place].  Are there other options they might be able to try.  They currently do not have rx coverage from my understanding.   Would greatly appreciate your help with this,  thx  :)

## 2015-11-21 ENCOUNTER — Telehealth: Payer: Self-pay | Admitting: Cardiology

## 2015-11-21 NOTE — Telephone Encounter (Signed)
New Message  Pts wife is calling with concerns of sense of urgency to husbands sooner appt., newly prescribed medication(s), and test results also.  Pts wife verbalized husband is out of town currently.  Please follow up with pts wife.

## 2015-11-21 NOTE — Telephone Encounter (Signed)
Left message to call back  

## 2015-11-21 NOTE — Telephone Encounter (Signed)
Spoke with pt's wife. She states she had received a message to contact Melissa to schedule an appt with Dr. Curt Bears.  Pt's wife reports pt is currently out of town until 8/18 but can come back sooner if Dr. Curt Bears feels he needs an appt prior to this date. Will forward to Dr. Curt Bears to see when he wants to see pt back.

## 2015-11-21 NOTE — Telephone Encounter (Signed)
No rush to return to clinic.  Wish to discuss options of AF treatment as he did not convert with initial cardioversion.

## 2015-11-22 NOTE — Telephone Encounter (Signed)
I spoke with pt's wife, she is aware of Dr Curt Bears comments.  Pt's wife is requesting appt sooner than October in August after pt returns 12/02/15.

## 2015-11-22 NOTE — Telephone Encounter (Signed)
Pt's appt has been rescheduled with Dr Curt Bears 12/14/15 at pt's wife request.

## 2015-12-13 NOTE — Progress Notes (Signed)
Electrophysiology Office Note   Date:  12/14/2015   ID:  [place], DOB 24-Aug-1940, MRN [id number]  PCP:  Simona Huh, MD  Primary Electrophysiologist:  Constance Haw, MD    Chief Complaint  Patient presents with  . Follow-up    3 months/PAF     History of Present Illness: [place] Roberson is a 75 y.o. male who presents today for electrophysiology evaluation.   He has a history of atrial fibrillation and hypertension. He was recently diagnosed with atrial fibrillation. He was started on rivaroxaban by his primary care physician. He says that he was getting a colonoscopy in March when he was noted to be in atrial fibrillation.  Cardioversion was attempted but not successful.  Today, he denies symptoms of palpitations, chest pain, shortness of breath, orthopnea, PND, lower extremity edema, claudication, dizziness, presyncope, syncope, bleeding, or neurologic sequela. The patient is tolerating medications without difficulties and is otherwise without complaint today. She went on a trip to [place] recently and was hiking around that were near. He was feeling well without any complaints of fatigue or shortness of breath during that time.   Past Medical History:  Diagnosis Date  . Atrial fibrillation ([place]) 08/2015  . 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 Erectile dysfunction   . Hypertension    Past Surgical History:  Procedure Laterality Date  . CARDIOVERSION N/A 11/10/2015   Procedure: CARDIOVERSION;  Surgeon: Larey Dresser, MD;  Location: [facility];  Service: Cardiovascular;  Laterality: N/A;  . COLONOSCOPY  2006  . KNEE SURGERY     x 5  . right anterior cruciate ligament tear    . VASECTOMY       Current Outpatient Prescriptions  Medication Sig Dispense Refill  . benazepril (LOTENSIN) 20 MG tablet Take 1 tablet (20 mg total) by mouth daily. 90 tablet 3  . carvedilol (COREG) 6.25 MG tablet Take 1 tablet (6.25 mg total) by mouth 2 (two)  times daily. 180 tablet 3  . meloxicam (MOBIC) 7.5 MG tablet Take 7.5 mg by mouth daily as needed for pain.     . rivaroxaban (XARELTO) 20 MG TABS tablet Take 20 mg by mouth daily with supper.    . sildenafil (REVATIO) 20 MG tablet Take 20 mg by mouth daily as needed (sexual activity).      No current facility-administered medications for this visit.     Allergies:   Review of patient's allergies indicates no known allergies.   Social History:  The patient  reports that he has never smoked. He has never used smokeless tobacco. He reports that he drinks about 1.8 oz of alcohol per week . He reports that he does not use drugs.   Family History:  The patient's family history includes Cirrhosis in his father; Hemochromatosis in his father; Hypertension in his mother; Liver cancer in his father.    ROS:  Please see the history of present illness.   Otherwise, review of systems is positive for none.   All other systems are reviewed and negative.    PHYSICAL EXAM: VS:  BP (!) 156/102   Pulse (!) 54   Ht 6\' 2"  (1.88 m)   Wt 207 lb 6.4 oz (94.1 kg)   BMI 26.63 kg/m  , BMI Body mass index is 26.63 kg/m. GEN: Well nourished, well developed, in no acute distress  HEENT: normal  Neck: no JVD, carotid bruits, or masses Cardiac: irregular; no murmurs, rubs,  or gallops,no edema  Respiratory:  clear to auscultation bilaterally, normal work of breathing GI: soft, nontender, nondistended, + BS MS: no deformity or atrophy  Skin: warm and dry Neuro:  Strength and sensation are intact Psych: euthymic mood, full affect  EKG:  EKG is ordered today. Personal review of the ECG done today shows atrial fibrillation, rate 54  Recent Labs: 09/13/2015: TSH 1.05 11/09/2015: BUN 18; Creat 0.77; Hemoglobin 13.9; Platelets 213; Potassium 4.4; Sodium 140    Lipid Panel  No results found for: CHOL, TRIG, HDL, CHOLHDL, VLDL, LDLCALC, LDLDIRECT   Wt Readings from Last 3 Encounters:  12/14/15 207 lb 6.4 oz  (94.1 kg)  11/09/15 199 lb 12.8 oz (90.6 kg)  10/17/15 201 lb 9.6 oz (91.4 kg)      Other studies Reviewed: Additional studies/ records that were reviewed today include: TTE 09/28/15 - Left ventricle: The cavity size was severely dilated. There was  mild focal basal hypertrophy of the septum. Systolic function was  mildly reduced. The estimated ejection fraction was in the range  of 45% to 50%. Diffuse hypokinesis. - Aortic valve: Trileaflet; moderately thickened, moderately  calcified leaflets. There was mild regurgitation. - Mitral valve: Calcified annulus. There was trivial regurgitation. - Left atrium: The appendage was severely dilated. - Right ventricle: The cavity size was mildly dilated. Wall  thickness was normal. Systolic function was normal. - Right atrium: The appendage was severely dilated. - Atrial septum: No defect or patent foramen ovale was identified  by color flow Doppler. - Tricuspid valve: There was mild regurgitation. - Pulmonary arteries: Systolic pressure was within the normal  range. PA peak pressure: 34 mm Hg (S). - Inferior vena cava: The vessel was dilated. The respirophasic  diameter changes were in the normal range (>= 50%), consistent  with elevated central venous pressure.  Impressions:  - Systolic function may be underestimated due to atrial  fibrillation.   ASSESSMENT AND PLAN:  1.  Persistent atrial fibrillation: was placed on rivaroxaban by his primary physician. Had cardioversion attempt without conversion to sinus rhythm.  This patients CHA2DS2-VASc Score and unadjusted Ischemic Stroke Rate (% per year) is equal to 2.2 % stroke rate/year from a score of 2  Above score calculated as 1 point each if present [CHF, HTN, DM, Vascular=MI/PAD/Aortic Plaque, Age if 65-74, or Male] Above score calculated as 2 points each if present [Age > 75, or Stroke/TIA/TE]  2. Hypertension: blood pressure today is  elevated today.  Will  continue current medications.  He will check his BP at home and will call with results.  Current medicines are reviewed at length with the patient today.   The patient does not have concerns regarding his medicines.  The following changes were made today: none  Labs/ tests ordered today include:  No orders of the defined types were placed in this encounter.    Disposition:   FU with Will Camnitz 6 months   Signed, [place] Meredith Leeds, MD  12/14/2015 9:59 AM     [facility] [address] [place] [address] [phone] (office) [phone] (fax)

## 2015-12-14 ENCOUNTER — Encounter: Payer: Self-pay | Admitting: Cardiology

## 2015-12-14 ENCOUNTER — Ambulatory Visit (INDEPENDENT_AMBULATORY_CARE_PROVIDER_SITE_OTHER): Payer: Medicare Other | Admitting: Cardiology

## 2015-12-14 VITALS — BP 156/102 | HR 54 | Ht 74.0 in | Wt 207.4 lb

## 2015-12-14 DIAGNOSIS — I4819 Other persistent atrial fibrillation: Secondary | ICD-10-CM

## 2015-12-14 DIAGNOSIS — I481 Persistent atrial fibrillation: Secondary | ICD-10-CM

## 2015-12-14 NOTE — Patient Instructions (Signed)
Medication Instructions:    Your physician recommends that you continue on your current medications as directed. Please refer to the Current Medication list given to you today.  --- If you need a refill on your cardiac medications before your next appointment, please call your pharmacy. ---  Labwork:  None ordered  Testing/Procedures:  None ordered  Follow-Up:  Your physician wants you to follow-up in: 6 months with Dr. Curt Bears.  You will receive a reminder letter in the mail two months in advance. If you don't receive a letter, please call our office to schedule the follow-up appointment.   Any Other Special Instructions Will Be Listed Below (If Applicable).  Please call the office in a couple of weeks with blood pressure readings.   Thank you for choosing [facility]!!   Trinidad Curet, RN [phone]

## 2015-12-22 DIAGNOSIS — D485 Neoplasm of uncertain behavior of skin: Secondary | ICD-10-CM | POA: Diagnosis not present

## 2015-12-22 DIAGNOSIS — D1800 Hemangioma unspecified site: Secondary | ICD-10-CM | POA: Diagnosis not present

## 2015-12-22 DIAGNOSIS — Z85828 Personal history of other malignant neoplasm of skin: Secondary | ICD-10-CM | POA: Diagnosis not present

## 2015-12-22 DIAGNOSIS — D225 Melanocytic nevi of trunk: Secondary | ICD-10-CM | POA: Diagnosis not present

## 2015-12-22 DIAGNOSIS — L814 Other melanin hyperpigmentation: Secondary | ICD-10-CM | POA: Diagnosis not present

## 2015-12-22 DIAGNOSIS — L821 Other seborrheic keratosis: Secondary | ICD-10-CM | POA: Diagnosis not present

## 2015-12-23 DIAGNOSIS — C44519 Basal cell carcinoma of skin of other part of trunk: Secondary | ICD-10-CM | POA: Diagnosis not present

## 2016-01-04 DIAGNOSIS — M1611 Unilateral primary osteoarthritis, right hip: Secondary | ICD-10-CM | POA: Diagnosis not present

## 2016-01-09 ENCOUNTER — Telehealth: Payer: Self-pay | Admitting: Cardiology

## 2016-01-09 NOTE — Telephone Encounter (Signed)
New message    Pt has questions about medicine and wants to share info about BP. Please call.

## 2016-01-09 NOTE — Telephone Encounter (Signed)
Reports BPs have been averaging 144-145/85-86.  Lowest has been 126/80.  Highest 157/85. HRs been averaging lower 60s.  Lowest 51. Highest 78.  Will forward to Dr. Curt Bears for his review.

## 2016-01-11 DIAGNOSIS — Z23 Encounter for immunization: Secondary | ICD-10-CM | POA: Diagnosis not present

## 2016-01-11 DIAGNOSIS — C44519 Basal cell carcinoma of skin of other part of trunk: Secondary | ICD-10-CM | POA: Diagnosis not present

## 2016-01-20 NOTE — Telephone Encounter (Signed)
Lets increase benazepril to 40 mg daily.

## 2016-01-23 ENCOUNTER — Encounter: Payer: Self-pay | Admitting: Cardiology

## 2016-01-23 MED ORDER — BENAZEPRIL HCL 40 MG PO TABS: 40.0000 mg | ORAL_TABLET | Freq: Every day | ORAL | 3 refills | Status: AC

## 2016-01-23 NOTE — Telephone Encounter (Signed)
Advised patient to increase Benazepril to 40 mg daily. New rx sent to [facility], [address]. Patient verbalized understanding and agreeable to plan.  He will call office if BPs do not improve.

## 2016-01-23 NOTE — Telephone Encounter (Signed)
Glyn Ade at 01/23/2016 9:30 AM   Status: Signed    Follow Up:    Returning your call from Friday.

## 2016-01-23 NOTE — Telephone Encounter (Signed)
This encounter was created in error - please disregard.

## 2016-01-23 NOTE — Addendum Note (Signed)
Addended by: Stanton Kidney on: 01/23/2016 02:46 PM   Modules accepted: Orders

## 2016-01-23 NOTE — Telephone Encounter (Signed)
Follow Up: ° ° ° ° ° °Returning your call from Friday. °

## 2016-02-08 DIAGNOSIS — Z23 Encounter for immunization: Secondary | ICD-10-CM | POA: Diagnosis not present

## 2016-02-09 ENCOUNTER — Ambulatory Visit: Payer: Medicare Other | Admitting: Cardiology

## 2016-02-21 ENCOUNTER — Telehealth: Payer: Self-pay | Admitting: Cardiology

## 2016-02-21 NOTE — Telephone Encounter (Signed)
Follow Up:; ° ° °Returning your call. °

## 2016-02-21 NOTE — Telephone Encounter (Signed)
I spoke to patient.  He states he would like samples of Xarelto.  He states he has obtained a prescription plan that will start in Jan 2018, but can not afford medication until then.  I put Xarelto up front for patient to pick up. He voiced understanding and thanks.

## 2016-03-20 ENCOUNTER — Telehealth: Payer: Self-pay | Admitting: Cardiology

## 2016-03-20 NOTE — Telephone Encounter (Signed)
Patient aware samples will be placed at the front desk. 

## 2016-03-20 NOTE — Telephone Encounter (Signed)
New message ° ° ° ° ° °Patient calling the office for samples of medication: ° ° °1.  What medication and dosage are you requesting samples for?  xarelto 20mg ° °2.  Are you currently out of this medication? Almost out of medication ° ° °

## 2016-04-16 HISTORY — PX: CATARACT EXTRACTION W/ INTRAOCULAR LENS IMPLANT: SHX1309

## 2016-06-12 ENCOUNTER — Encounter: Payer: Self-pay | Admitting: Cardiology

## 2016-06-12 ENCOUNTER — Ambulatory Visit (INDEPENDENT_AMBULATORY_CARE_PROVIDER_SITE_OTHER): Payer: Medicare Other | Admitting: Cardiology

## 2016-06-12 ENCOUNTER — Encounter (INDEPENDENT_AMBULATORY_CARE_PROVIDER_SITE_OTHER): Payer: Self-pay

## 2016-06-12 VITALS — BP 148/100 | HR 66 | Ht 74.0 in | Wt 204.6 lb

## 2016-06-12 DIAGNOSIS — I4819 Other persistent atrial fibrillation: Secondary | ICD-10-CM

## 2016-06-12 DIAGNOSIS — I481 Persistent atrial fibrillation: Secondary | ICD-10-CM

## 2016-06-12 NOTE — Patient Instructions (Signed)
Medication Instructions:  Your physician recommends that you continue on your current medications as directed. Please refer to the Current Medication list given to you today.  * If you need a refill on your cardiac medications before your next appointment, please call your pharmacy.   Labwork: None ordered  Testing/Procedures: None ordered  Follow-Up: Your physician wants you to follow-up in: 6 months with Dr. Camnitz.  You will receive a reminder letter in the mail two months in advance. If you don't receive a letter, please call our office to schedule the follow-up appointment.  Thank you for choosing [facility]!!   Akyra Bouchie, RN [phone]      r 

## 2016-06-12 NOTE — Progress Notes (Signed)
Electrophysiology Office Note   Date:  06/12/2016   ID:  [place], DOB 11/09/1940, MRN [id number]  PCP:  Simona Huh, MD  Primary Electrophysiologist:  Constance Haw, MD    Chief Complaint  Patient presents with  . Follow-up    Persistent Afib     History of Present Illness: [place] is a 76 y.o. male who presents today for electrophysiology evaluation.   He has a history of atrial fibrillation and hypertension. He was recently diagnosed with atrial fibrillation. He was started on rivaroxaban by his primary care physician. He says that he was getting a colonoscopy in March when he was noted to be in atrial fibrillation.  Cardioversion was attempted but not successful.  Today, he denies symptoms of palpitations, chest pain, shortness of breath, orthopnea, PND, lower extremity edema, claudication, dizziness, presyncope, syncope, bleeding, or neurologic sequela. The patient is tolerating medications without difficulties and is otherwise without complaint today.    Past Medical History:  Diagnosis Date  . Atrial fibrillation ([place]) 08/2015  . 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 Erectile dysfunction   . Hypertension    Past Surgical History:  Procedure Laterality Date  . CARDIOVERSION N/A 11/10/2015   Procedure: CARDIOVERSION;  Surgeon: Larey Dresser, MD;  Location: [facility];  Service: Cardiovascular;  Laterality: N/A;  . COLONOSCOPY  2006  . KNEE SURGERY     x 5  . right anterior cruciate ligament tear    . VASECTOMY       Current Outpatient Prescriptions  Medication Sig Dispense Refill  . benazepril (LOTENSIN) 40 MG tablet Take 1 tablet (40 mg total) by mouth daily. 90 tablet 3  . carvedilol (COREG) 6.25 MG tablet Take 1 tablet (6.25 mg total) by mouth 2 (two) times daily. 180 tablet 3  . meloxicam (MOBIC) 7.5 MG tablet Take 7.5 mg by mouth daily as needed for pain.     . rivaroxaban (XARELTO) 20 MG TABS tablet Take 20 mg by  mouth daily with supper.    . sildenafil (REVATIO) 20 MG tablet Take 20 mg by mouth daily as needed (sexual activity).      No current facility-administered medications for this visit.     Allergies:   Patient has no known allergies.   Social History:  The patient  reports that he has never smoked. He has never used smokeless tobacco. He reports that he drinks about 1.8 oz of alcohol per week . He reports that he does not use drugs.   Family History:  The patient's family history includes Cirrhosis in his father; Hemochromatosis in his father; Hypertension in his mother; Liver cancer in his father.    ROS:  Please see the history of present illness.   Otherwise, review of systems is positive for none.   All other systems are reviewed and negative.    PHYSICAL EXAM: VS:  BP (!) 148/100   Pulse 66   Ht 6\' 2"  (1.88 m)   Wt 204 lb 9.6 oz (92.8 kg)   BMI 26.27 kg/m  , BMI Body mass index is 26.27 kg/m. GEN: Well nourished, well developed, in no acute distress  HEENT: normal  Neck: no JVD, carotid bruits, or masses Cardiac: irregular; no murmurs, rubs, or gallops,no edema  Respiratory:  clear to auscultation bilaterally, normal work of breathing GI: soft, nontender, nondistended, + BS MS: no deformity or atrophy  Skin: warm and dry Neuro:  Strength and  sensation are intact Psych: euthymic mood, full affect  EKG:  EKG is ordered today. Personal review of the ECG done 12/14/15 shows atrial fibrillation, rate 54  Recent Labs: 09/13/2015: TSH 1.05 11/09/2015: BUN 18; Creat 0.77; Hemoglobin 13.9; Platelets 213; Potassium 4.4; Sodium 140    Lipid Panel  No results found for: CHOL, TRIG, HDL, CHOLHDL, VLDL, LDLCALC, LDLDIRECT   Wt Readings from Last 3 Encounters:  06/12/16 204 lb 9.6 oz (92.8 kg)  12/14/15 207 lb 6.4 oz (94.1 kg)  11/09/15 199 lb 12.8 oz (90.6 kg)      Other studies Reviewed: Additional studies/ records that were reviewed today include: TTE 09/28/15 - Left  ventricle: The cavity size was severely dilated. There was  mild focal basal hypertrophy of the septum. Systolic function was  mildly reduced. The estimated ejection fraction was in the range  of 45% to 50%. Diffuse hypokinesis. - Aortic valve: Trileaflet; moderately thickened, moderately  calcified leaflets. There was mild regurgitation. - Mitral valve: Calcified annulus. There was trivial regurgitation. - Left atrium: The appendage was severely dilated. - Right ventricle: The cavity size was mildly dilated. Wall  thickness was normal. Systolic function was normal. - Right atrium: The appendage was severely dilated. - Atrial septum: No defect or patent foramen ovale was identified  by color flow Doppler. - Tricuspid valve: There was mild regurgitation. - Pulmonary arteries: Systolic pressure was within the normal  range. PA peak pressure: 34 mm Hg (S). - Inferior vena cava: The vessel was dilated. The respirophasic  diameter changes were in the normal range (>= 50%), consistent  with elevated central venous pressure.  Impressions:  - Systolic function may be underestimated due to atrial  fibrillation.   ASSESSMENT AND PLAN:  1.  Persistent atrial fibrillation: was placed on rivaroxaban by his primary physician. Had cardioversion attempt without conversion to sinus rhythm. As he is feeling well in atrial fibrillation, we'll not make any further attempts to get him into normal rhythm. He is tolerating his anticoagulation well. No further adjustments at this time.  This patients CHA2DS2-VASc Score and unadjusted Ischemic Stroke Rate (% per year) is equal to 2.2 % stroke rate/year from a score of 2  Above score calculated as 1 point each if present [CHF, HTN, DM, Vascular=MI/PAD/Aortic Plaque, Age if 65-74, or Male] Above score calculated as 2 points each if present [Age > 75, or Stroke/TIA/TE]  2. Hypertension: blood pressure today is  elevated today.  [place] continue  current medications.  We Om [place] touch base with him in 2 weeks to get blood pressures at home. Should they be elevated, we'll start him on Norvasc.  Current medicines are reviewed at length with the patient today.   The patient does not have concerns regarding his medicines.  The following changes were made today: none  Labs/ tests ordered today include:  No orders of the defined types were placed in this encounter.    Disposition:   FU with [place] 6 months   Signed, Bradley Strine Meredith Leeds, MD  06/12/2016 2:41 PM     [facility] [address] [place] [address] [phone] (office) [phone] (fax)

## 2016-08-08 DIAGNOSIS — H25813 Combined forms of age-related cataract, bilateral: Secondary | ICD-10-CM | POA: Diagnosis not present

## 2016-08-08 DIAGNOSIS — H5213 Myopia, bilateral: Secondary | ICD-10-CM | POA: Diagnosis not present

## 2016-08-30 DIAGNOSIS — I10 Essential (primary) hypertension: Secondary | ICD-10-CM | POA: Diagnosis not present

## 2016-08-30 DIAGNOSIS — M169 Osteoarthritis of hip, unspecified: Secondary | ICD-10-CM | POA: Diagnosis not present

## 2016-08-30 DIAGNOSIS — N529 Male erectile dysfunction, unspecified: Secondary | ICD-10-CM | POA: Diagnosis not present

## 2016-08-30 DIAGNOSIS — I4891 Unspecified atrial fibrillation: Secondary | ICD-10-CM | POA: Diagnosis not present

## 2016-09-06 DIAGNOSIS — H25812 Combined forms of age-related cataract, left eye: Secondary | ICD-10-CM | POA: Diagnosis not present

## 2016-09-26 DIAGNOSIS — I10 Essential (primary) hypertension: Secondary | ICD-10-CM | POA: Diagnosis not present

## 2016-10-04 DIAGNOSIS — H25811 Combined forms of age-related cataract, right eye: Secondary | ICD-10-CM | POA: Diagnosis not present

## 2016-11-20 DIAGNOSIS — M1712 Unilateral primary osteoarthritis, left knee: Secondary | ICD-10-CM | POA: Diagnosis not present

## 2016-11-20 DIAGNOSIS — M1611 Unilateral primary osteoarthritis, right hip: Secondary | ICD-10-CM | POA: Diagnosis not present

## 2016-11-20 DIAGNOSIS — M1711 Unilateral primary osteoarthritis, right knee: Secondary | ICD-10-CM | POA: Diagnosis not present

## 2016-12-10 ENCOUNTER — Ambulatory Visit (INDEPENDENT_AMBULATORY_CARE_PROVIDER_SITE_OTHER): Payer: Medicare Other | Admitting: Cardiology

## 2016-12-10 ENCOUNTER — Encounter: Payer: Self-pay | Admitting: Cardiology

## 2016-12-10 VITALS — BP 156/90 | HR 56 | Ht 74.0 in | Wt 204.6 lb

## 2016-12-10 DIAGNOSIS — I481 Persistent atrial fibrillation: Secondary | ICD-10-CM | POA: Diagnosis not present

## 2016-12-10 DIAGNOSIS — I4819 Other persistent atrial fibrillation: Secondary | ICD-10-CM

## 2016-12-10 DIAGNOSIS — I10 Essential (primary) hypertension: Secondary | ICD-10-CM

## 2016-12-10 NOTE — Progress Notes (Signed)
Electrophysiology Office Note   Date:  12/10/2016   ID:  [place], DOB [birth year], MRN [id number]  PCP:  Gaynelle Arabian, MD  Primary Electrophysiologist:  [name], MD    Chief Complaint  Patient presents with  . Follow-up    Persistent Afib     History of Present Illness: [place] is a 76 y.o. male who presents today for electrophysiology evaluation.   He has a history of atrial fibrillation and hypertension. He was recently diagnosed with atrial fibrillation. He was started on rivaroxaban by his primary care physician. He says that he was getting a colonoscopy in March when he was noted to be in atrial fibrillation.  Cardioversion was attempted but not successful.  Today, denies symptoms of palpitations, chest pain, shortness of breath, orthopnea, PND, lower extremity edema, claudication, dizziness, presyncope, syncope, bleeding, or neurologic sequela. He is potentially a little bit fatigued. His wife says that he sleeps quite a bit, but they are unsure if this is due to staying up late at night. He is continuing to exercise and is not having issues at the gym.  Past Medical History:  Diagnosis Date  . Atrial fibrillation ([place]) 08/2015  . 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 Erectile dysfunction   . Hypertension    Past Surgical History:  Procedure Laterality Date  . CARDIOVERSION N/A 11/10/2015   Procedure: CARDIOVERSION;  Surgeon: Larey Dresser, MD;  Location: [facility];  Service: Cardiovascular;  Laterality: N/A;  . COLONOSCOPY  2006  . KNEE SURGERY     x 5  . right anterior cruciate ligament tear    . VASECTOMY       Current Outpatient Prescriptions  Medication Sig Dispense Refill  . benazepril (LOTENSIN) 40 MG tablet Take 1 tablet (40 mg total) by mouth daily. 90 tablet 3  . carvedilol (COREG) 6.25 MG tablet Take 1 tablet (6.25 mg total) by mouth 2 (two) times daily. 180 tablet 3  . meloxicam (MOBIC) 7.5 MG tablet  Take 7.5 mg by mouth daily as needed for pain.     . rivaroxaban (XARELTO) 20 MG TABS tablet Take 20 mg by mouth daily with supper.    . sildenafil (REVATIO) 20 MG tablet Take 20 mg by mouth daily as needed (sexual activity).      No current facility-administered medications for this visit.     Allergies:   Patient has no known allergies.   Social History:  The patient  reports that he has never smoked. He has never used smokeless tobacco. He reports that he drinks about 1.8 oz of alcohol per week . He reports that he does not use drugs.   Family History:  The patient's family history includes Cirrhosis in his father; Hemochromatosis in his father; Hypertension in his mother; Liver cancer in his father.    ROS:  Please see the history of present illness.   Otherwise, review of systems is positive for Cough, easy bruising`.   All other systems are reviewed and negative.   PHYSICAL EXAM: VS:  BP (!) 156/90   Pulse (!) 56   Ht 6\' 2"  (1.88 m)   Wt 204 lb 9.6 oz (92.8 kg)   BMI 26.27 kg/m  , BMI Body mass index is 26.27 kg/m. GEN: Well nourished, well developed, in no acute distress  HEENT: normal  Neck: no JVD, carotid bruits, or masses Cardiac: RRR; no murmurs, rubs, or gallops,no edema  Respiratory:  clear to auscultation bilaterally, normal work of breathing GI: soft, nontender, nondistended, + BS MS: no deformity or atrophy  Skin: warm and dry Neuro:  Strength and sensation are intact Psych: euthymic mood, full affect  EKG:  EKG is ordered today. Personal review of the ekg ordered shows atrial fibrillation, rate 56   Recent Labs: No results found for requested labs within last 8760 hours.    Lipid Panel  No results found for: CHOL, TRIG, HDL, CHOLHDL, VLDL, LDLCALC, LDLDIRECT   Wt Readings from Last 3 Encounters:  12/10/16 204 lb 9.6 oz (92.8 kg)  06/12/16 204 lb 9.6 oz (92.8 kg)  12/14/15 207 lb 6.4 oz (94.1 kg)      Other studies Reviewed: Additional studies/  records that were reviewed today include: TTE 09/28/15 - Left ventricle: The cavity size was severely dilated. There was  mild focal basal hypertrophy of the septum. Systolic function was  mildly reduced. The estimated ejection fraction was in the range  of 45% to 50%. Diffuse hypokinesis. - Aortic valve: Trileaflet; moderately thickened, moderately  calcified leaflets. There was mild regurgitation. - Mitral valve: Calcified annulus. There was trivial regurgitation. - Left atrium: The appendage was severely dilated. - Right ventricle: The cavity size was mildly dilated. Wall  thickness was normal. Systolic function was normal. - Right atrium: The appendage was severely dilated. - Atrial septum: No defect or patent foramen ovale was identified  by color flow Doppler. - Tricuspid valve: There was mild regurgitation. - Pulmonary arteries: Systolic pressure was within the normal  range. PA peak pressure: 34 mm Hg (S). - Inferior vena cava: The vessel was dilated. The respirophasic  diameter changes were in the normal range (>= 50%), consistent  with elevated central venous pressure.  Impressions:  - Systolic function may be underestimated due to atrial  fibrillation.   ASSESSMENT AND PLAN:  1.  Persistent atrial fibrillation: Currently on Xarelto. He is feeling well not having any major issues other than a little bit of fatigue. We'll plan to stop her carvedilol, and if his fatigue improves, we'll continue to hold this. Should his fatigue not change, [name] restart carvedilol. At this point I feel that it is cold to get him to maintain sinus rhythm.  This patients CHA2DS2-VASc Score and unadjusted Ischemic Stroke Rate (% per year) is equal to 3.2 % stroke rate/year from a score of 3  Above score calculated as 1 point each if present [CHF, HTN, DM, Vascular=MI/PAD/Aortic Plaque, Age if 65-74, or Male] Above score calculated as 2 points each if present [Age > 75, or  Stroke/TIA/TE]    2. Hypertension: Blood pressure today is much better, which it has also been on recordings at home. We are planning to stop carvedilol, and thus he may need an increase in his Norvasc.  Current medicines are reviewed at length with the patient today.   The patient does not have concerns regarding his medicines.  The following changes were made today: Hold carvedilol  Labs/ tests ordered today include:  No orders of the defined types were placed in this encounter.    Disposition:   FU with Jaonna Word 6 months   Signed, Jamere Stidham Meredith Leeds, MD  12/10/2016 10:44 AM     [facility] [address] [place] [address] [phone] (office) [phone] (fax)

## 2016-12-10 NOTE — Patient Instructions (Addendum)
Medication Instructions:  Your physician recommends that you continue on your current medications as directed. Please refer to the Current Medication list given to you today.  If you need a refill on your cardiac medications before your next appointment, please call your pharmacy.   Labwork: None ordered  Testing/Procedures: None ordered  Follow-Up: Your physician wants you to follow-up in: 6 months with Dr. Camnitz.  You will receive a reminder letter in the mail two months in advance. If you don't receive a letter, please call our office to schedule the follow-up appointment.  Thank you for choosing [facility]!!   Yoltzin Ransom, RN [phone]         

## 2016-12-11 DIAGNOSIS — R05 Cough: Secondary | ICD-10-CM | POA: Diagnosis not present

## 2016-12-26 DIAGNOSIS — L821 Other seborrheic keratosis: Secondary | ICD-10-CM | POA: Diagnosis not present

## 2016-12-26 DIAGNOSIS — D225 Melanocytic nevi of trunk: Secondary | ICD-10-CM | POA: Diagnosis not present

## 2016-12-26 DIAGNOSIS — L814 Other melanin hyperpigmentation: Secondary | ICD-10-CM | POA: Diagnosis not present

## 2016-12-26 DIAGNOSIS — D1800 Hemangioma unspecified site: Secondary | ICD-10-CM | POA: Diagnosis not present

## 2016-12-26 DIAGNOSIS — Z23 Encounter for immunization: Secondary | ICD-10-CM | POA: Diagnosis not present

## 2016-12-26 DIAGNOSIS — Z85828 Personal history of other malignant neoplasm of skin: Secondary | ICD-10-CM | POA: Diagnosis not present

## 2017-01-07 DIAGNOSIS — Z23 Encounter for immunization: Secondary | ICD-10-CM | POA: Diagnosis not present

## 2017-01-14 ENCOUNTER — Telehealth: Payer: Self-pay | Admitting: Cardiology

## 2017-01-14 NOTE — Telephone Encounter (Signed)
Patient calling the office for samples of medication:   1.  What medication and dosage are you requesting samples for?  xarelto 20mg   2.  Are you currently out of this medication? Two weeks

## 2017-01-14 NOTE — Telephone Encounter (Signed)
Spoke with patient and made him aware that I would place two samples at the front desk. I have made him aware that we would not be able to provide samples for the remainder of the year. Patient verbalized his understanding and was appreciative.

## 2017-02-27 NOTE — Pre-Procedure Instructions (Signed)
Bradley Roberson  02/27/2017      [place], [place] [address] Phone: [phone] Fax: [place], [place] [address] Phone: [phone] Fax: [phone]    Your procedure is scheduled on  Monday 03/11/17  Report to [facility] at 530 A.M.  Call this number if you have problems the morning of surgery:  [phone]   Remember:  Do not eat food or drink liquids after midnight.  Take these medicines the morning of surgery with A SIP OF WATER -   Do not wear jewelry, make-up or nail polish.  Do not wear lotions, powders, or perfumes, or deoderant.  Do not shave 48 hours prior to surgery.  Men may shave face and neck.  Do not bring valuables to the hospital.  [facility] is not responsible for any belongings or valuables.  Contacts, dentures or bridgework may not be worn into surgery.  Leave your suitcase in the car.  After surgery it may be brought to your room.  For patients admitted to the hospital, discharge time will be determined by your treatment team.  Patients discharged the day of surgery will not be allowed to drive home.   Name and phone number of your driver:    Special instructions:  [place] - Preparing for Surgery  Before surgery, you can play an important role.  Because skin is not sterile, your skin needs to be as free of germs as possible.  You can reduce the number of germs on you skin by washing with CHG (chlorahexidine gluconate) soap before surgery.  CHG is an antiseptic cleaner which kills germs and bonds with the skin to continue killing germs even after washing.  Please DO NOT use if you have an allergy to CHG or antibacterial soaps.  If your skin becomes reddened/irritated stop using the CHG and inform your nurse when you arrive at Short  Stay.  Do not shave (including legs and underarms) for at least 48 hours prior to the first CHG shower.  You may shave your face.  Please follow these instructions carefully:   1.  Shower with CHG Soap the night before surgery and the                                morning of Surgery.  2.  If you choose to wash your hair, wash your hair first as usual with your       normal shampoo.  3.  After you shampoo, rinse your hair and body thoroughly to remove the                      Shampoo.  4.  Use CHG as you would any other liquid soap.  You can apply chg directly       to the skin and wash gently with scrungie or a clean washcloth.  5.  Apply the CHG Soap to your body ONLY FROM THE NECK DOWN.        Do not use on open wounds or open sores.  Avoid contact with your eyes,       ears, mouth and genitals (private parts).  Wash genitals (private parts)       with your normal  soap.  6.  Wash thoroughly, paying special attention to the area where your surgery        will be performed.  7.  Thoroughly rinse your body with warm water from the neck down.  8.  DO NOT shower/wash with your normal soap after using and rinsing off       the CHG Soap.  9.  Pat yourself dry with a clean towel.            10.  Wear clean pajamas.            11.  Place clean sheets on your bed the night of your first shower and do not        sleep with pets.  Day of Surgery  Do not apply any lotions/deoderants the morning of surgery.  Please wear clean clothes to the hospital/surgery center.    Please read over the following fact sheets that you were given. MRSA Information and Surgical Site Infection Prevention

## 2017-02-28 ENCOUNTER — Encounter (HOSPITAL_COMMUNITY): Payer: Self-pay

## 2017-02-28 ENCOUNTER — Ambulatory Visit (HOSPITAL_COMMUNITY)
Admission: RE | Admit: 2017-02-28 | Discharge: 2017-02-28 | Disposition: A | Discharge status: Home / Self Care | Payer: Medicare Other | Source: Ambulatory Visit | Attending: Orthopedic Surgery | Admitting: Orthopedic Surgery

## 2017-02-28 ENCOUNTER — Encounter (HOSPITAL_COMMUNITY)
Admission: RE | Admit: 2017-02-28 | Discharge: 2017-02-28 | Disposition: A | Discharge status: Home / Self Care | Payer: Medicare Other | Source: Ambulatory Visit | Attending: Orthopedic Surgery | Admitting: Orthopedic Surgery

## 2017-02-28 ENCOUNTER — Other Ambulatory Visit: Payer: Self-pay

## 2017-02-28 DIAGNOSIS — I4891 Unspecified atrial fibrillation: Secondary | ICD-10-CM | POA: Insufficient documentation

## 2017-02-28 DIAGNOSIS — Z7901 Long term (current) use of anticoagulants: Secondary | ICD-10-CM | POA: Insufficient documentation

## 2017-02-28 DIAGNOSIS — Z79899 Other long term (current) drug therapy: Secondary | ICD-10-CM | POA: Insufficient documentation

## 2017-02-28 DIAGNOSIS — R918 Other nonspecific abnormal finding of lung field: Secondary | ICD-10-CM | POA: Diagnosis not present

## 2017-02-28 DIAGNOSIS — Z01812 Encounter for preprocedural laboratory examination: Secondary | ICD-10-CM | POA: Insufficient documentation

## 2017-02-28 DIAGNOSIS — Z01818 Encounter for other preprocedural examination: Secondary | ICD-10-CM

## 2017-02-28 DIAGNOSIS — Z01810 Encounter for preprocedural cardiovascular examination: Secondary | ICD-10-CM | POA: Diagnosis not present

## 2017-02-28 DIAGNOSIS — I10 Essential (primary) hypertension: Secondary | ICD-10-CM | POA: Insufficient documentation

## 2017-02-28 LAB — BASIC METABOLIC PANEL
Anion gap: 6 (ref 5–15)
BUN: 13 mg/dL (ref 6–20)
CALCIUM: 8.6 mg/dL — AB (ref 8.9–10.3)
CHLORIDE: 105 mmol/L (ref 101–111)
CO2: 28 mmol/L (ref 22–32)
CREATININE: 0.77 mg/dL (ref 0.61–1.24)
GFR calc non Af Amer: >60 mL/min (ref >60)
GLUCOSE: 95 mg/dL (ref 65–99)
Potassium: 4.2 mmol/L (ref 3.5–5.1)
Sodium: 139 mmol/L (ref 135–145)

## 2017-02-28 LAB — CBC WITH DIFFERENTIAL/PLATELET
BASOS PCT: 1 %
Basophils Absolute: 0 10*3/uL (ref 0.0–0.1)
EOS ABS: 0.2 10*3/uL (ref 0.0–0.7)
EOS PCT: 3 %
HCT: 39.2 % (ref 39.0–52.0)
Hemoglobin: 13.8 g/dL (ref 13.0–17.0)
LYMPHS ABS: 1.2 10*3/uL (ref 0.7–4.0)
Lymphocytes Relative: 23 %
MCH: 32.8 pg (ref 26.0–34.0)
MCHC: 35.2 g/dL (ref 30.0–36.0)
MCV: 93.1 fL (ref 78.0–100.0)
MONO ABS: 0.4 10*3/uL (ref 0.1–1.0)
MONOS PCT: 8 %
NEUTROS PCT: 65 %
Neutro Abs: 3.6 10*3/uL (ref 1.7–7.7)
PLATELETS: 222 10*3/uL (ref 150–400)
RBC: 4.21 MIL/uL — ABNORMAL LOW (ref 4.22–5.81)
RDW: 12.5 % (ref 11.5–15.5)
WBC: 5.4 10*3/uL (ref 4.0–10.5)

## 2017-02-28 LAB — PROTIME-INR
INR: 1.8
PROTHROMBIN TIME: 20.7 s — AB (ref 11.4–15.2)

## 2017-02-28 LAB — ABO/RH: ABO/RH(D): A POS

## 2017-02-28 LAB — TYPE AND SCREEN
ABO/RH(D): A POS
Antibody Screen: NEGATIVE

## 2017-02-28 LAB — APTT: aPTT: 41 s — ABNORMAL HIGH (ref 24–36)

## 2017-02-28 LAB — SURGICAL PCR SCREEN
MRSA, PCR: NEGATIVE
Staphylococcus aureus: NEGATIVE

## 2017-02-28 NOTE — Pre-Procedure Instructions (Signed)
Bradley Roberson  02/28/2017      [place], [place] [address] Phone: [phone] Fax: [place], [place] [address] Phone: [phone] Fax: [phone]    Your procedure is scheduled on  Monday 03/11/17  Report to [facility] at 530 A.M.  Call this number if you have problems the morning of surgery:  [phone]   Remember:  Do not eat food or drink liquids after midnight the night before your surgery.  Take these medicines the morning of surgery with A SIP OF WATER - Acetaminophen (Tylenol) - if needed Amlodipine (Norvasc)  7 days prior to surgery STOP taking Meloxicam (Mobic), any Aspirin (unless otherwise instructed by your surgeon), Aleve, Naproxen, Ibuprofen, Motrin, Advil, Goody's, BC's, all herbal medications, fish oil, and all vitamins.  Follow your doctors instructions regarding your Xarelto.  If no instructions were given by your doctor, then you will need to call the prescribing office office to get instructions.      Do not wear jewelry  Do not wear lotions, powders, or colognes, or deodorant.  Men may shave face and neck.  Do not bring valuables to the hospital.  [facility] is not responsible for any belongings or valuables.  Contacts, eyeglasses, dentures or bridgework may not be worn into surgery.  Leave your suitcase in the car.  After surgery it may be brought to your room.  For patients admitted to the hospital, discharge time will be determined by your treatment team.  Patients discharged the day of surgery will not be allowed to drive home.   Name and phone number of your driver:    Special instructions:   [place]- Preparing For Surgery  Before surgery, you can play an important role. Because skin is not sterile, your skin needs to be as  free of germs as possible. You can reduce the number of germs on your skin by washing with CHG (chlorahexidine gluconate) Soap before surgery.  CHG is an antiseptic cleaner which kills germs and bonds with the skin to continue killing germs even after washing.  Please do not use if you have an allergy to CHG or antibacterial soaps. If your skin becomes reddened/irritated stop using the CHG.  Do not shave (including legs and underarms) for at least 48 hours prior to first CHG shower. It is OK to shave your face.  Please follow these instructions carefully.   1. Shower the NIGHT BEFORE SURGERY and the MORNING OF SURGERY with CHG.   2. If you chose to wash your hair, wash your hair first as usual with your normal shampoo.  3. After you shampoo, rinse your hair and body thoroughly to remove the shampoo.  4. Use CHG as you would any other liquid soap. You can apply CHG directly to the skin and wash gently with a scrungie or a clean washcloth.   5. Apply the CHG Soap to your body ONLY FROM THE NECK DOWN.  Do not use on open wounds or open sores. Avoid contact with your eyes, ears, mouth and genitals (private parts). Wash Face and genitals (private parts)  with your normal soap.  6. Wash thoroughly, paying special attention to the area where your surgery will be performed.  7. Thoroughly rinse your body with warm water from the neck down.  8.  DO NOT shower/wash with your normal soap after using and rinsing off the CHG Soap.  9. Pat yourself dry with a CLEAN TOWEL.  10. Wear CLEAN PAJAMAS to bed the night before surgery, wear comfortable clothes the morning of surgery  11. Place CLEAN SHEETS on your bed the night of your first shower and DO NOT SLEEP WITH PETS.    Day of Surgery: Shower as stated above. Do not apply any deodorants/lotions. Please wear clean clothes to the hospital/surgery center.     Please read over the following fact sheets that you were given.

## 2017-02-28 NOTE — Progress Notes (Signed)
PCP - Dr. Laqueta Linden Cardiologist - Dr. Curt Bears (manages patient's afib)  Chest x-ray - 02/28/2017 EKG - 02/28/2017 Stress Test - 15+ years ago ECHO - 09/2015 Cardiac Cath - patient denies  Sleep Study - patient denies  Blood Thinner Instructions: sent message to Dr. Curt Bears regarding instructions for Xarelto.  Will contact patient with instructions. Aspirin Instructions:   Patient denies shortness of breath, fever, cough and chest pain at PAT appointment   Patient verbalized understanding of instructions that were given to them at the PAT appointment. Patient was also instructed that they will need to review over the PAT instructions again at home before surgery.

## 2017-02-28 NOTE — Progress Notes (Signed)
Spoke with Sandi Raveling at Dr. Damita Dunnings office regarding instructions for patient's xarelto.  She will follow up with Dr. Macky Lower office tomorrow and get in touch with the patient.  The patient has been notified of this.

## 2017-03-01 ENCOUNTER — Telehealth: Payer: Self-pay | Admitting: Cardiology

## 2017-03-01 NOTE — Telephone Encounter (Signed)
° °  [facility] Pre-operative Risk Assessment    Request for surgical clearance:  1. What type of surgery is being performed? Rt Hip Replacement   2. When is this surgery scheduled? 03-11-17   3. Are there any medications that need to be held prior to surgery and how long?Clearance regarding abnormal Echo in 2017 and Afib History-Can pt hold his Xarelto-If so three prior to surgery   4. Practice name and name of physician performing surgery? Dr [name]   5. What is your office phone and fax number?  [phone] and fax [phone]   6. Anesthesia type (None, local, MAC, general) ? Spinal   Bradley Roberson 03/01/2017, 2:57 PM  _________________________________________________________________   (provider comments below)

## 2017-03-01 NOTE — Telephone Encounter (Signed)
Clearance routed via Epic fax to requesting surgeon

## 2017-03-01 NOTE — Telephone Encounter (Addendum)
    Chart reviewed as part of pre-operative protocol coverage. Patient was contacted 03/01/2017 in reference to pre-operative risk assessment for pending surgery as outlined below.  Bradley Roberson was last seen on 12/10/2016 by Dr. Curt Bears.  Since that day, Bradley Roberson has done well.  He runs a 10-minute mile 3 days a week on a treadmill and lifts weights.  Although he needs a hip replacement, he is still able to do that and has done so this week.  He does not feel at all limited in his activity level by shortness of breath.  He never gets chest pain.  His renal function is normal.  He has no recent cardioversion or other EP procedures.  He is in atrial fibrillation all the time, his heart rate runs low at times but he has no history of presyncope or syncope.    He was advised to hold the Xarelto for 3 days prior to surgery and restart as soon as possible after it, per the surgeons.  Therefore, based on ACC/AHA guidelines, the patient would be at acceptable risk for the planned procedure without further cardiovascular testing.   I will route this recommendation to the requesting party via Epic fax function and remove from pre-op pool.  Please call with questions.  Rosaria Ferries, PA-C 03/01/2017, 4:19 PM

## 2017-03-01 NOTE — Progress Notes (Addendum)
Anesthesia Chart Review: Patient is a 76 year old male scheduled for right anterior hip arthroplasty on 03/11/17 by Dr. [name]. Case is posted for spinal.   History includes never smoker, HTN, afib s/p unsuccessful cardioversion 11/10/15, ED.   PCP is Dr. Gaynelle Arabian.  Cardiologist (EP) is Dr. Allegra Lai.  Meds include amlodipine, benazepril, Coreg, Xarelto, sildenafil.   BP 139/83   Pulse 72   Temp 36.7 C   Resp 20   Ht 6\' 2"  (1.88 m)   Wt 204 lb 9.6 oz (92.8 kg)   SpO2 100%   BMI 26.27 kg/m    EKG 02/28/17: Afib at 75 bpm. Rate is faster when compared to 12/10/16 tracing.   Echo 09/28/15: Study Conclusions - Left ventricle: The cavity size was severely dilated. There was   mild focal basal hypertrophy of the septum. Systolic function was   mildly reduced. The estimated ejection fraction was in the range   of 45% to 50%. Diffuse hypokinesis. - Aortic valve: Trileaflet; moderately thickened, moderately   calcified leaflets. There was mild regurgitation. - Mitral valve: Calcified annulus. There was trivial regurgitation. - Left atrium: The appendage was severely dilated. - Right ventricle: The cavity size was mildly dilated. Wall   thickness was normal. Systolic function was normal. - Right atrium: The appendage was severely dilated. - Atrial septum: No defect or patent foramen ovale was identified   by color flow Doppler. - Tricuspid valve: There was mild regurgitation. - Pulmonary arteries: Systolic pressure was within the normal   range. PA peak pressure: 34 mm Hg (S). - Inferior vena cava: The vessel was dilated. The respirophasic   diameter changes were in the normal range (>= 50%), consistent   with elevated central venous pressure. Impressions: - Systolic function may be underestimated due to atrial   fibrillation.  CXR 02/28/17: IMPRESSION: No active cardiopulmonary disease.  Preoperative labs noted. Cr 0.77, H/H 13.8/39.2. INR 1.80, PT 20.7. PTT 41.  Glucose 95. T&S done. Patient is on Xarelto--surgeon to instruct patient when to hold once confirmed with cardiology. Plan repeat PT/PTT on the day of surgery.   Patient with chronic afib. His echo showed EF 45-50% with diffuse hypokinesis in 09/2015. I don't see that any ischemic evaluation was ordered at that time. Dr. Curt Bears commented that EF may be underestimated due to afib. Unfortunately, cardioversion was unsuccessful. Dr. Damita Dunnings office has already reached out to Dr. Curt Bears' office to see if Xarelto can be held for 72 hours (since spinal anesthesia anticipated); however, I also reached out this afternoon to inquire about clearance status--with abnormal echo in 2017 I wanted to see if any additional testing is indicated (whether repeat echo or ischemic testing).   Chart will be left for follow-up.  [name] [facility] Short Stay Center/Anesthesiology Phone [phone] 03/01/2017 3:09 PM   Addendum: Cardiac clearance received by Rosaria Ferries, PA-C. Patient reported ability to run 10 minute miles three days a week on a treadmill and was also lifting weights. He denied chest pains and did not feel limited by any SOB. He had no syncope or presyncope. She wrote, "Therefore, based on ACC/AHA guidelines, the patient would be at acceptable risk for the planned procedure without further cardiovascular testing." She initially advised to hold Xarelto for 48 hours, but then updated to hold for 3 days. Patient reported last dose to be 03/07/17. Clearance reviewed with anesthesiologist Dr. Suella Broad. Patient with known chronic afib, but METS > 4 and is asymptomatic. If no  acute changes then it is anticipated that he can proceed as planned.  [name] [name] [facility] Short Stay Center/Anesthesiology Phone [phone] 03/04/2017 10:14 AM

## 2017-03-04 ENCOUNTER — Other Ambulatory Visit: Payer: Self-pay | Admitting: Orthopedic Surgery

## 2017-03-06 NOTE — H&P (Signed)
TOTAL HIP ADMISSION H&P  Patient is admitted for right total hip arthroplasty.  Subjective:  Chief Complaint: right hip pain  HPI: Bradley Roberson, 76 y.o. male, has a history of pain and functional disability in the right hip(s) due to arthritis and patient has failed non-surgical conservative treatments for greater than 12 weeks to include NSAID's and/or analgesics, flexibility and strengthening excercises, weight reduction as appropriate and activity modification.  Onset of symptoms was gradual starting 5 years ago with gradually worsening course since that time.The patient noted no past surgery on the right hip(s).  Patient currently rates pain in the right hip at 10 out of 10 with activity. Patient has night pain, worsening of pain with activity and weight bearing, trendelenberg gait, pain that interfers with activities of daily living and pain with passive range of motion. Patient has evidence of joint space narrowing by imaging studies. This condition presents safety issues increasing the risk of falls.  There is no current active infection.  Patient Active Problem List   Diagnosis Date Noted  . Degenerative joint disease (DJD) of hip   . Erectile dysfunction   . Atrial fibrillation ([place]) 08/15/2015   Past Medical History:  Diagnosis Date  . Atrial fibrillation ([place]) 08/2015  . 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 Erectile dysfunction   . Hypertension     Past Surgical History:  Procedure Laterality Date  . CARDIOVERSION N/A 11/10/2015   Procedure: CARDIOVERSION;  Surgeon: Larey Dresser, MD;  Location: [facility];  Service: Cardiovascular;  Laterality: N/A;  . CATARACT EXTRACTION W/ INTRAOCULAR LENS IMPLANT  2018  . COLONOSCOPY  2006  . KNEE SURGERY     x 5  . right anterior cruciate ligament tear    . VASECTOMY      No current facility-administered medications for this encounter.    Current Outpatient Medications  Medication Sig Dispense Refill Last  Dose  . acetaminophen (TYLENOL) 650 MG CR tablet Take 650 mg every 8 (eight) hours as needed by mouth for pain.     Marland Kitchen amLODipine (NORVASC) 5 MG tablet Take 5 mg daily by mouth.     . benazepril (LOTENSIN) 40 MG tablet Take 1 tablet (40 mg total) by mouth daily. 90 tablet 3 Taking  . meloxicam (MOBIC) 7.5 MG tablet Take 7.5 mg by mouth daily as needed for pain.    Taking  . rivaroxaban (XARELTO) 20 MG TABS tablet Take 20 mg daily with breakfast by mouth.    Taking  . sildenafil (REVATIO) 20 MG tablet Take 20 mg by mouth daily as needed (sexual activity).    Taking  . carvedilol (COREG) 6.25 MG tablet Take 1 tablet (6.25 mg total) by mouth 2 (two) times daily. (Patient not taking: Reported on 02/27/2017) 180 tablet 3 Not Taking at Unknown time   No Known Allergies  Social History   Tobacco Use  . Smoking status: Never Smoker  . Smokeless tobacco: Never Used  Substance Use Topics  . Alcohol use: Yes    Alcohol/week: 1.8 oz    Types: 3 Cans of beer per week    Family History  Problem Relation Age of Onset  . Hypertension Mother   . Cirrhosis Father   . Hemochromatosis Father   . Liver cancer Father      Review of Systems  Constitutional: Negative.   HENT: Negative.   Eyes: Negative.   Respiratory: Negative.   Cardiovascular:  Irregular heart beat  Gastrointestinal: Negative.   Genitourinary: Negative.   Musculoskeletal: Positive for joint pain.  Skin: Negative.   Neurological: Negative.   Endo/Heme/Allergies: Bruises/bleeds easily.  Psychiatric/Behavioral: Negative.     Objective:  Physical Exam  Constitutional: He is oriented to person, place, and time. He appears well-developed and well-nourished.  HENT:  Head: Normocephalic and atraumatic.  Eyes: Pupils are equal, round, and reactive to light.  Neck: Normal range of motion. Neck supple.  Cardiovascular: Intact distal pulses.  Respiratory: Effort normal.  Musculoskeletal: He exhibits tenderness.  the patient  does have discomfort with any attempt of internal rotation of the right hip.  He can only internally rotate to approximately 5 on the right.  15 on the left.  His calves are soft and nontender.  He is neurovascularly intact distally.  Patient has good strength and good range of motion in his knees.  He does have obvious crepitance with range of motion.  Mild to moderate pain with palpation over the medial joint line bilaterally.  No instability.    Neurological: He is alert and oriented to person, place, and time.  Skin: Skin is warm and dry.  Psychiatric: He has a normal mood and affect. His behavior is normal. Judgment and thought content normal.    Vital signs in last 24 hours:    Labs:   Estimated body mass index is 26.27 kg/m as calculated from the following:   Height as of 02/28/17: 6\' 2"  (1.88 m).   Weight as of 02/28/17: 92.8 kg (204 lb 9.6 oz).   Imaging Review Plain radiographs demonstrate AP of the pelvis and crosstable lateral of the right hip are reviewed in office today.  This shows end-stage arthritis right hip bone-on-bone  Assessment/Plan:  End stage arthritis, right hip(s)  The patient history, physical examination, clinical judgement of the provider and imaging studies are consistent with end stage degenerative joint disease of the right hip(s) and total hip arthroplasty is deemed medically necessary. The treatment options including medical management, injection therapy, arthroscopy and arthroplasty were discussed at length. The risks and benefits of total hip arthroplasty were presented and reviewed. The risks due to aseptic loosening, infection, stiffness, dislocation/subluxation,  thromboembolic complications and other imponderables were discussed.  The patient acknowledged the explanation, agreed to proceed with the plan and consent was signed. Patient is being admitted for inpatient treatment for surgery, pain control, PT, OT, prophylactic antibiotics, VTE  prophylaxis, progressive ambulation and ADL's and discharge planning.The patient is planning to be discharged home with home health services.

## 2017-03-08 MED ORDER — LACTATED RINGERS IV SOLN
INTRAVENOUS | Status: DC
  Administered 2017-03-11 (×2): via INTRAVENOUS

## 2017-03-08 MED ORDER — CEFAZOLIN SODIUM-DEXTROSE 2-4 GM/100ML-% IV SOLN
2.0000 g | INTRAVENOUS | Status: AC
  Administered 2017-03-11: 2 g via INTRAVENOUS
  Filled 2017-03-08: qty 100

## 2017-03-08 MED ORDER — BUPIVACAINE LIPOSOME 1.3 % IJ SUSP
20.0000 mL | INTRAMUSCULAR | Status: AC
  Administered 2017-03-11: 20 mL
  Filled 2017-03-08: qty 20

## 2017-03-08 MED ORDER — TRANEXAMIC ACID 1000 MG/10ML IV SOLN
1000.0000 mg | INTRAVENOUS | Status: AC
  Administered 2017-03-11: 1000 mg via INTRAVENOUS
  Filled 2017-03-08: qty 10

## 2017-03-08 MED ORDER — TRANEXAMIC ACID 1000 MG/10ML IV SOLN
2000.0000 mg | INTRAVENOUS | Status: AC
  Administered 2017-03-11: 2000 mg via TOPICAL
  Filled 2017-03-08: qty 20

## 2017-03-10 NOTE — Anesthesia Preprocedure Evaluation (Addendum)
Anesthesia Evaluation  Patient identified by MRN, date of birth, ID band Patient awake    Reviewed: Allergy & Precautions, H&P , NPO status , Patient's Chart, lab work & pertinent test results  Airway Mallampati: II  TM Distance: >3 FB Neck ROM: Full    Dental  (+) Dental Advisory Given, Teeth Intact   Pulmonary neg pulmonary ROS, neg sleep apnea, neg COPD,    breath sounds clear to auscultation       Cardiovascular Exercise Tolerance: Good hypertension, Pt. on medications (-) Past MI + dysrhythmias Atrial Fibrillation  Rhythm:Irregular Rate:Normal  TTE 2017 - Severely dilated LV, ejection fraction was in the range of 45% to 50%. Diffuse hypokinesis. Mild AI, trivial MR, severely dilated LAA and RAA, mildly dilated RV with normal systolic fx, and mild TR.    Neuro/Psych negative neurological ROS  negative psych ROS   GI/Hepatic negative GI ROS, Neg liver ROS, neg GERD  ,  Endo/Other  negative endocrine ROS  Renal/GU negative Renal ROS     Musculoskeletal  (+) Arthritis ,   Abdominal   Peds  Hematology negative hematology ROS (+)   Anesthesia Other Findings   Reproductive/Obstetrics                           Anesthesia Physical  Anesthesia Plan  ASA: III  Anesthesia Plan: Spinal   Post-op Pain Management:    Induction: Intravenous  PONV Risk Score and Plan: 1 and Propofol infusion and Treatment may vary due to age or medical condition  Airway Management Planned: Simple Face Mask  Additional Equipment: None  Intra-op Plan:   Post-operative Plan:   Informed Consent: I have reviewed the patients History and Physical, chart, labs and discussed the procedure including the risks, benefits and alternatives for the proposed anesthesia with the patient or authorized representative who has indicated his/her understanding and acceptance.   Dental advisory given  Plan Discussed with:  CRNA  Anesthesia Plan Comments: (Plan for spinal with 0.75% hyperbaric bupivacaine)      Anesthesia Quick Evaluation

## 2017-03-11 ENCOUNTER — Inpatient Hospital Stay (HOSPITAL_COMMUNITY): Payer: Medicare Other

## 2017-03-11 ENCOUNTER — Other Ambulatory Visit: Payer: Self-pay

## 2017-03-11 ENCOUNTER — Inpatient Hospital Stay (HOSPITAL_COMMUNITY): Payer: Medicare Other | Admitting: Emergency Medicine

## 2017-03-11 ENCOUNTER — Encounter (HOSPITAL_COMMUNITY): Payer: Self-pay

## 2017-03-11 ENCOUNTER — Encounter (HOSPITAL_COMMUNITY)
Admission: RE | Disposition: A | Discharge status: Home / Self Care | Payer: Self-pay | Source: Ambulatory Visit | Attending: Orthopedic Surgery

## 2017-03-11 ENCOUNTER — Inpatient Hospital Stay (HOSPITAL_COMMUNITY): Payer: Medicare Other | Admitting: Vascular Surgery

## 2017-03-11 ENCOUNTER — Inpatient Hospital Stay (HOSPITAL_COMMUNITY)
Admission: RE | Admit: 2017-03-11 | Discharge: 2017-03-13 | DRG: 470 | Disposition: A | Discharge status: Home / Self Care | Payer: Medicare Other | Source: Ambulatory Visit | Attending: Orthopedic Surgery | Admitting: Orthopedic Surgery

## 2017-03-11 DIAGNOSIS — I10 Essential (primary) hypertension: Secondary | ICD-10-CM | POA: Diagnosis not present

## 2017-03-11 DIAGNOSIS — Z7901 Long term (current) use of anticoagulants: Secondary | ICD-10-CM | POA: Diagnosis not present

## 2017-03-11 DIAGNOSIS — Z9181 History of falling: Secondary | ICD-10-CM | POA: Diagnosis not present

## 2017-03-11 DIAGNOSIS — I4891 Unspecified atrial fibrillation: Secondary | ICD-10-CM | POA: Diagnosis not present

## 2017-03-11 DIAGNOSIS — Z419 Encounter for procedure for purposes other than remedying health state, unspecified: Secondary | ICD-10-CM

## 2017-03-11 DIAGNOSIS — M1611 Unilateral primary osteoarthritis, right hip: Principal | ICD-10-CM | POA: Diagnosis present

## 2017-03-11 DIAGNOSIS — Z96641 Presence of right artificial hip joint: Secondary | ICD-10-CM | POA: Diagnosis not present

## 2017-03-11 DIAGNOSIS — Z471 Aftercare following joint replacement surgery: Secondary | ICD-10-CM | POA: Diagnosis not present

## 2017-03-11 HISTORY — PX: TOTAL HIP ARTHROPLASTY: SHX124

## 2017-03-11 LAB — APTT: APTT: 30 s (ref 24–36)

## 2017-03-11 LAB — PROTIME-INR
INR: 1.08
Prothrombin Time: 13.9 seconds (ref 11.4–15.2)

## 2017-03-11 SURGERY — ARTHROPLASTY, HIP, TOTAL, ANTERIOR APPROACH
Anesthesia: Spinal | Site: Hip | Laterality: Right

## 2017-03-11 MED ORDER — PHENYLEPHRINE HCL 10 MG/ML IJ SOLN
INTRAMUSCULAR | Status: DC | PRN
  Administered 2017-03-11 (×5): 80 ug via INTRAVENOUS

## 2017-03-11 MED ORDER — ALUM & MAG HYDROXIDE-SIMETH 200-200-20 MG/5ML PO SUSP: 30.0000 mL | ORAL | Status: DC | PRN

## 2017-03-11 MED ORDER — PHENYLEPHRINE HCL 10 MG/ML IJ SOLN
INTRAVENOUS | Status: DC | PRN
  Administered 2017-03-11: 20 ug/min via INTRAVENOUS

## 2017-03-11 MED ORDER — PROPOFOL 10 MG/ML IV BOLUS
INTRAVENOUS | Status: AC
  Filled 2017-03-11: qty 20

## 2017-03-11 MED ORDER — 0.9 % SODIUM CHLORIDE (POUR BTL) OPTIME
TOPICAL | Status: DC | PRN
  Administered 2017-03-11: 1000 mL

## 2017-03-11 MED ORDER — OXYCODONE HCL 5 MG PO TABS: 5.0000 mg | ORAL_TABLET | ORAL | Status: DC | PRN

## 2017-03-11 MED ORDER — MENTHOL 3 MG MT LOZG: 1.0000 | LOZENGE | OROMUCOSAL | Status: DC | PRN

## 2017-03-11 MED ORDER — ONDANSETRON HCL 4 MG/2ML IJ SOLN: 4.0000 mg | Freq: Once | INTRAMUSCULAR | Status: DC | PRN

## 2017-03-11 MED ORDER — PROPOFOL 500 MG/50ML IV EMUL
INTRAVENOUS | Status: DC | PRN
  Administered 2017-03-11: 75 ug/kg/min via INTRAVENOUS
  Administered 2017-03-11: 40 ug/kg/min via INTRAVENOUS

## 2017-03-11 MED ORDER — OXYCODONE HCL 5 MG PO TABS: 10.0000 mg | ORAL_TABLET | ORAL | Status: DC | PRN

## 2017-03-11 MED ORDER — KCL IN DEXTROSE-NACL 20-5-0.45 MEQ/L-%-% IV SOLN
INTRAVENOUS | Status: DC
  Administered 2017-03-11: 21:00:00 via INTRAVENOUS
  Filled 2017-03-11: qty 1000

## 2017-03-11 MED ORDER — DIPHENHYDRAMINE HCL 12.5 MG/5ML PO ELIX: 12.5000 mg | ORAL_SOLUTION | ORAL | Status: DC | PRN

## 2017-03-11 MED ORDER — BUPIVACAINE-EPINEPHRINE (PF) 0.5% -1:200000 IJ SOLN
INTRAMUSCULAR | Status: DC | PRN
  Administered 2017-03-11: 20 mL via PERINEURAL
  Administered 2017-03-11: 30 mL via PERINEURAL

## 2017-03-11 MED ORDER — TRANEXAMIC ACID 1000 MG/10ML IV SOLN
1000.0000 mg | Freq: Once | INTRAVENOUS | Status: AC
  Administered 2017-03-11: 1000 mg via INTRAVENOUS
  Filled 2017-03-11: qty 10

## 2017-03-11 MED ORDER — ONDANSETRON HCL 4 MG/2ML IJ SOLN
INTRAMUSCULAR | Status: DC | PRN
  Administered 2017-03-11: 4 mg via INTRAVENOUS

## 2017-03-11 MED ORDER — GABAPENTIN 300 MG PO CAPS
300.0000 mg | ORAL_CAPSULE | Freq: Three times a day (TID) | ORAL | Status: DC
  Administered 2017-03-11 – 2017-03-13 (×6): 300 mg via ORAL
  Filled 2017-03-11 (×6): qty 1

## 2017-03-11 MED ORDER — CELECOXIB 200 MG PO CAPS
200.0000 mg | ORAL_CAPSULE | Freq: Two times a day (BID) | ORAL | Status: DC
  Administered 2017-03-11 – 2017-03-13 (×4): 200 mg via ORAL
  Filled 2017-03-11 (×4): qty 1

## 2017-03-11 MED ORDER — OXYCODONE-ACETAMINOPHEN 5-325 MG PO TABS: 1.0000 | ORAL_TABLET | ORAL | 0 refills | Status: DC | PRN

## 2017-03-11 MED ORDER — METHOCARBAMOL 500 MG PO TABS
500.0000 mg | ORAL_TABLET | Freq: Four times a day (QID) | ORAL | Status: DC | PRN
  Administered 2017-03-11 – 2017-03-12 (×3): 500 mg via ORAL
  Filled 2017-03-11 (×3): qty 1

## 2017-03-11 MED ORDER — SENNOSIDES-DOCUSATE SODIUM 8.6-50 MG PO TABS: 1.0000 | ORAL_TABLET | Freq: Every evening | ORAL | Status: DC | PRN

## 2017-03-11 MED ORDER — METOCLOPRAMIDE HCL 5 MG/ML IJ SOLN: 5.0000 mg | Freq: Three times a day (TID) | INTRAMUSCULAR | Status: DC | PRN

## 2017-03-11 MED ORDER — RIVAROXABAN 10 MG PO TABS
10.0000 mg | ORAL_TABLET | Freq: Every day | ORAL | Status: DC
  Administered 2017-03-12 – 2017-03-13 (×2): 10 mg via ORAL
  Filled 2017-03-11 (×2): qty 1

## 2017-03-11 MED ORDER — ACETAMINOPHEN 325 MG PO TABS
650.0000 mg | ORAL_TABLET | ORAL | Status: DC | PRN
  Administered 2017-03-11 – 2017-03-13 (×6): 650 mg via ORAL
  Filled 2017-03-11 (×6): qty 2

## 2017-03-11 MED ORDER — RIVAROXABAN 10 MG PO TABS: 10.0000 mg | ORAL_TABLET | Freq: Every day | ORAL | 0 refills | Status: DC

## 2017-03-11 MED ORDER — DEXAMETHASONE SODIUM PHOSPHATE 10 MG/ML IJ SOLN
10.0000 mg | Freq: Once | INTRAMUSCULAR | Status: AC
  Administered 2017-03-12: 10 mg via INTRAVENOUS
  Filled 2017-03-11: qty 1

## 2017-03-11 MED ORDER — METOCLOPRAMIDE HCL 5 MG PO TABS: 5.0000 mg | ORAL_TABLET | Freq: Three times a day (TID) | ORAL | Status: DC | PRN

## 2017-03-11 MED ORDER — FENTANYL CITRATE (PF) 250 MCG/5ML IJ SOLN
INTRAMUSCULAR | Status: DC | PRN
  Administered 2017-03-11: 50 ug via INTRAVENOUS

## 2017-03-11 MED ORDER — DEXTROSE 5 % IV SOLN: 500.0000 mg | Freq: Four times a day (QID) | INTRAVENOUS | Status: DC | PRN

## 2017-03-11 MED ORDER — FLEET ENEMA 7-19 GM/118ML RE ENEM: 1.0000 | ENEMA | Freq: Once | RECTAL | Status: DC | PRN

## 2017-03-11 MED ORDER — BENAZEPRIL HCL 20 MG PO TABS
40.0000 mg | ORAL_TABLET | Freq: Every day | ORAL | Status: DC
  Administered 2017-03-12 – 2017-03-13 (×2): 40 mg via ORAL
  Filled 2017-03-11 (×2): qty 2

## 2017-03-11 MED ORDER — AMLODIPINE BESYLATE 5 MG PO TABS
5.0000 mg | ORAL_TABLET | Freq: Every day | ORAL | Status: DC
Start: 2017-03-12 — End: 2017-03-13
  Administered 2017-03-12 – 2017-03-13 (×2): 5 mg via ORAL
  Filled 2017-03-11 (×2): qty 1

## 2017-03-11 MED ORDER — BUPIVACAINE-EPINEPHRINE (PF) 0.5% -1:200000 IJ SOLN
INTRAMUSCULAR | Status: AC
  Filled 2017-03-11: qty 60

## 2017-03-11 MED ORDER — PHENOL 1.4 % MT LIQD: 1.0000 | OROMUCOSAL | Status: DC | PRN

## 2017-03-11 MED ORDER — HYDROMORPHONE HCL 1 MG/ML IJ SOLN: 0.5000 mg | INTRAMUSCULAR | Status: DC | PRN

## 2017-03-11 MED ORDER — DEXAMETHASONE SODIUM PHOSPHATE 10 MG/ML IJ SOLN
INTRAMUSCULAR | Status: DC | PRN
  Administered 2017-03-11: 5 mg via INTRAVENOUS

## 2017-03-11 MED ORDER — ONDANSETRON HCL 4 MG PO TABS: 4.0000 mg | ORAL_TABLET | Freq: Four times a day (QID) | ORAL | Status: DC | PRN

## 2017-03-11 MED ORDER — CHLORHEXIDINE GLUCONATE 4 % EX LIQD: 60.0000 mL | Freq: Once | CUTANEOUS | Status: DC

## 2017-03-11 MED ORDER — TIZANIDINE HCL 2 MG PO TABS: 2.0000 mg | ORAL_TABLET | Freq: Four times a day (QID) | ORAL | 0 refills | Status: DC | PRN

## 2017-03-11 MED ORDER — ZOLPIDEM TARTRATE 5 MG PO TABS: 5.0000 mg | ORAL_TABLET | Freq: Every evening | ORAL | Status: DC | PRN

## 2017-03-11 MED ORDER — ONDANSETRON HCL 4 MG/2ML IJ SOLN
4.0000 mg | Freq: Four times a day (QID) | INTRAMUSCULAR | Status: DC | PRN
  Administered 2017-03-11: 4 mg via INTRAVENOUS
  Filled 2017-03-11: qty 2

## 2017-03-11 MED ORDER — LIDOCAINE HCL (CARDIAC) 20 MG/ML IV SOLN
INTRAVENOUS | Status: DC | PRN
  Administered 2017-03-11: 50 mg via INTRATRACHEAL

## 2017-03-11 MED ORDER — DOCUSATE SODIUM 100 MG PO CAPS
100.0000 mg | ORAL_CAPSULE | Freq: Two times a day (BID) | ORAL | Status: DC
  Administered 2017-03-11 – 2017-03-13 (×4): 100 mg via ORAL
  Filled 2017-03-11 (×4): qty 1

## 2017-03-11 MED ORDER — BISACODYL 5 MG PO TBEC: 5.0000 mg | DELAYED_RELEASE_TABLET | Freq: Every day | ORAL | Status: DC | PRN

## 2017-03-11 MED ORDER — CARVEDILOL 6.25 MG PO TABS: 6.2500 mg | ORAL_TABLET | Freq: Two times a day (BID) | ORAL | Status: DC

## 2017-03-11 MED ORDER — PROPOFOL 10 MG/ML IV BOLUS
INTRAVENOUS | Status: DC | PRN
  Administered 2017-03-11 (×3): 10 mg via INTRAVENOUS

## 2017-03-11 MED ORDER — ACETAMINOPHEN 650 MG RE SUPP: 650.0000 mg | RECTAL | Status: DC | PRN

## 2017-03-11 MED ORDER — FENTANYL CITRATE (PF) 100 MCG/2ML IJ SOLN: 25.0000 ug | INTRAMUSCULAR | Status: DC | PRN

## 2017-03-11 MED ORDER — FENTANYL CITRATE (PF) 250 MCG/5ML IJ SOLN
INTRAMUSCULAR | Status: AC
  Filled 2017-03-11: qty 5

## 2017-03-11 MED ORDER — DIPHENHYDRAMINE HCL 50 MG/ML IJ SOLN
INTRAMUSCULAR | Status: DC | PRN
  Administered 2017-03-11: 20 mg via INTRAVENOUS

## 2017-03-11 MED ORDER — BUPIVACAINE IN DEXTROSE 0.75-8.25 % IT SOLN
INTRATHECAL | Status: DC | PRN
  Administered 2017-03-11: 1.6 mL via INTRATHECAL

## 2017-03-11 SURGICAL SUPPLY — 43 items
BAG DECANTER FOR FLEXI CONT (MISCELLANEOUS) ×2 IMPLANT
BLADE SAW SGTL 18X1.27X75 (BLADE) ×2 IMPLANT
CAPT HIP TOTAL 2 ×1 IMPLANT
COVER PERINEAL POST (MISCELLANEOUS) ×2 IMPLANT
COVER SURGICAL LIGHT HANDLE (MISCELLANEOUS) ×2 IMPLANT
DRAPE C-ARM 42X72 X-RAY (DRAPES) ×2 IMPLANT
DRAPE STERI IOBAN 125X83 (DRAPES) ×2 IMPLANT
DRAPE U-SHAPE 47X51 STRL (DRAPES) ×4 IMPLANT
DRSG AQUACEL AG ADV 3.5X10 (GAUZE/BANDAGES/DRESSINGS) ×2 IMPLANT
DURAPREP 26ML APPLICATOR (WOUND CARE) ×2 IMPLANT
ELECT BLADE 4.0 EZ CLEAN MEGAD (MISCELLANEOUS) ×2
ELECT REM PT RETURN 9FT ADLT (ELECTROSURGICAL) ×2
ELECTRODE BLDE 4.0 EZ CLN MEGD (MISCELLANEOUS) ×1 IMPLANT
ELECTRODE REM PT RTRN 9FT ADLT (ELECTROSURGICAL) ×1 IMPLANT
FACESHIELD WRAPAROUND (MASK) ×4 IMPLANT
FACESHIELD WRAPAROUND OR TEAM (MASK) ×2 IMPLANT
GLOVE BIO SURGEON STRL SZ7.5 (GLOVE) ×3 IMPLANT
GLOVE BIO SURGEON STRL SZ8.5 (GLOVE) ×2 IMPLANT
GLOVE BIOGEL PI IND STRL 8 (GLOVE) ×1 IMPLANT
GLOVE BIOGEL PI IND STRL 9 (GLOVE) ×1 IMPLANT
GLOVE BIOGEL PI INDICATOR 8 (GLOVE) ×1
GLOVE BIOGEL PI INDICATOR 9 (GLOVE) ×1
GOWN STRL REUS W/ TWL LRG LVL3 (GOWN DISPOSABLE) ×1 IMPLANT
GOWN STRL REUS W/ TWL XL LVL3 (GOWN DISPOSABLE) ×2 IMPLANT
GOWN STRL REUS W/TWL LRG LVL3 (GOWN DISPOSABLE) ×2
GOWN STRL REUS W/TWL XL LVL3 (GOWN DISPOSABLE) ×4
KIT BASIN OR (CUSTOM PROCEDURE TRAY) ×2 IMPLANT
KIT ROOM TURNOVER OR (KITS) ×2 IMPLANT
MANIFOLD NEPTUNE II (INSTRUMENTS) ×2 IMPLANT
NEEDLE HYPO 22GX1.5 SAFETY (NEEDLE) ×4 IMPLANT
NS IRRIG 1000ML POUR BTL (IV SOLUTION) ×2 IMPLANT
PACK TOTAL JOINT (CUSTOM PROCEDURE TRAY) ×2 IMPLANT
PAD ARMBOARD 7.5X6 YLW CONV (MISCELLANEOUS) ×3 IMPLANT
SUT VIC AB 1 CTX 36 (SUTURE) ×2
SUT VIC AB 1 CTX36XBRD ANBCTR (SUTURE) ×1 IMPLANT
SUT VIC AB 2-0 CT1 27 (SUTURE) ×2
SUT VIC AB 2-0 CT1 TAPERPNT 27 (SUTURE) ×1 IMPLANT
SUT VIC AB 3-0 CT1 27 (SUTURE) ×2
SUT VIC AB 3-0 CT1 TAPERPNT 27 (SUTURE) ×1 IMPLANT
SYR CONTROL 10ML LL (SYRINGE) ×4 IMPLANT
TOWEL OR 17X24 6PK STRL BLUE (TOWEL DISPOSABLE) ×2 IMPLANT
TOWEL OR 17X26 10 PK STRL BLUE (TOWEL DISPOSABLE) ×2 IMPLANT
TRAY CATH 16FR W/PLASTIC CATH (SET/KITS/TRAYS/PACK) ×1 IMPLANT

## 2017-03-11 NOTE — Anesthesia Postprocedure Evaluation (Signed)
Anesthesia Post Note  Patient: Bradley Roberson  Procedure(s) Performed: RIGHT TOTAL HIP ARTHROPLASTY ANTERIOR APPROACH (Right Hip)     Patient location during evaluation: PACU Anesthesia Type: Spinal Level of consciousness: awake and alert Pain management: pain level controlled Vital Signs Assessment: post-procedure vital signs reviewed and stable Respiratory status: spontaneous breathing and respiratory function stable Cardiovascular status: blood pressure returned to baseline and stable Postop Assessment: spinal receding and no apparent nausea or vomiting Anesthetic complications: no    Last Vitals:  Vitals:   03/11/17 1015 03/11/17 1030  BP: 117/79 115/86  Pulse:  (!) 54  Resp:  13  Temp:  (!) 36.1 C  SpO2:  98%    Last Pain:  Vitals:   03/11/17 1030  TempSrc:   PainSc: [name]

## 2017-03-11 NOTE — Interval H&P Note (Signed)
History and Physical Interval Note:  03/11/2017 7:06 AM  Bradley Roberson  has presented today for surgery, with the diagnosis of RIGHT HIP OSTEOARTHRITIS  The various methods of treatment have been discussed with the patient and family. After consideration of risks, benefits and other options for treatment, the patient has consented to  Procedure(s): RIGHT TOTAL HIP ARTHROPLASTY ANTERIOR APPROACH (Right) as a surgical intervention .  The patient's history has been reviewed, patient examined, no change in status, stable for surgery.  I have reviewed the patient's chart and labs.  Questions were answered to the patient's satisfaction.     Kerin Salen

## 2017-03-11 NOTE — Discharge Instructions (Addendum)
INSTRUCTIONS AFTER JOINT REPLACEMENT  ° °o Remove items at home which could result in a fall. This includes throw rugs or furniture in walking pathways °o ICE to the affected joint every three hours while awake for 30 minutes at a time, for at least the first 3-5 days, and then as needed for pain and swelling.  Continue to use ice for pain and swelling. You may notice swelling that will progress down to the foot and ankle.  This is normal after surgery.  Elevate your leg when you are not up walking on it.   °o Continue to use the breathing machine you got in the hospital (incentive spirometer) which will help keep your temperature down.  It is common for your temperature to cycle up and down following surgery, especially at night when you are not up moving around and exerting yourself.  The breathing machine keeps your lungs expanded and your temperature down. ° ° °DIET:  As you were doing prior to hospitalization, we recommend a well-balanced diet. ° °DRESSING / WOUND CARE / SHOWERING ° °Keep the surgical dressing until follow up.  The dressing is water proof, so you can shower without any extra covering.  IF THE DRESSING FALLS OFF or the wound gets wet inside, change the dressing with sterile gauze.  Please use good hand washing techniques before changing the dressing.  Do not use any lotions or creams on the incision until instructed by your surgeon.   ° °ACTIVITY ° °o Increase activity slowly as tolerated, but follow the weight bearing instructions below.   °o No driving for 6 weeks or until further direction given by your physician.  You cannot drive while taking narcotics.  °o No lifting or carrying greater than 10 lbs. until further directed by your surgeon. °o Avoid periods of inactivity such as sitting longer than an hour when not asleep. This helps prevent blood clots.  °o You may return to work once you are authorized by your doctor.  ° ° ° °WEIGHT BEARING  ° °Weight bearing as tolerated with assist  device (walker, cane, etc) as directed, use it as long as suggested by your surgeon or therapist, typically at least 4-6 weeks. ° ° °EXERCISES ° °Results after joint replacement surgery are often greatly improved when you follow the exercise, range of motion and muscle strengthening exercises prescribed by your doctor. Safety measures are also important to protect the joint from further injury. Any time any of these exercises cause you to have increased pain or swelling, decrease what you are doing until you are comfortable again and then slowly increase them. If you have problems or questions, call your caregiver or physical therapist for advice.  ° °Rehabilitation is important following a joint replacement. After just a few days of immobilization, the muscles of the leg can become weakened and shrink (atrophy).  These exercises are designed to build up the tone and strength of the thigh and leg muscles and to improve motion. Often times heat used for twenty to thirty minutes before working out will loosen up your tissues and help with improving the range of motion but do not use heat for the first two weeks following surgery (sometimes heat can increase post-operative swelling).  ° °These exercises can be done on a training (exercise) mat, on the floor, on a table or on a bed. Use whatever works the best and is most comfortable for you.    Use music or television while you are exercising so that   the exercises are a pleasant break in your day. This will make your life better with the exercises acting as a break in your routine that you can look forward to.   Perform all exercises about fifteen times, three times per day or as directed.  You should exercise both the operative leg and the other leg as well. ° °Exercises include: °  °• Quad Sets - Tighten up the muscle on the front of the thigh (Quad) and hold for 5-10 seconds.   °• Straight Leg Raises - With your knee straight (if you were given a brace, keep it on),  lift the leg to 60 degrees, hold for 3 seconds, and slowly lower the leg.  Perform this exercise against resistance later as your leg gets stronger.  °• Leg Slides: Lying on your back, slowly slide your foot toward your buttocks, bending your knee up off the floor (only go as far as is comfortable). Then slowly slide your foot back down until your leg is flat on the floor again.  °• Angel Wings: Lying on your back spread your legs to the side as far apart as you can without causing discomfort.  °• Hamstring Strength:  Lying on your back, push your heel against the floor with your leg straight by tightening up the muscles of your buttocks.  Repeat, but this time bend your knee to a comfortable angle, and push your heel against the floor.  You may put a pillow under the heel to make it more comfortable if necessary.  ° °A rehabilitation program following joint replacement surgery can speed recovery and prevent re-injury in the future due to weakened muscles. Contact your doctor or a physical therapist for more information on knee rehabilitation.  ° ° °CONSTIPATION ° °Constipation is defined medically as fewer than three stools per week and severe constipation as less than one stool per week.  Even if you have a regular bowel pattern at home, your normal regimen is likely to be disrupted due to multiple reasons following surgery.  Combination of anesthesia, postoperative narcotics, change in appetite and fluid intake all can affect your bowels.  ° °YOU MUST use at least one of the following options; they are listed in order of increasing strength to get the job done.  They are all available over the counter, and you may need to use some, POSSIBLY even all of these options:   ° °Drink plenty of fluids (prune juice may be helpful) and high fiber foods °Colace 100 mg by mouth twice a day  °Senokot for constipation as directed and as needed Dulcolax (bisacodyl), take with full glass of water  °Miralax (polyethylene glycol)  once or twice a day as needed. ° °If you have tried all these things and are unable to have a bowel movement in the first 3-4 days after surgery call either your surgeon or your primary doctor.   ° °If you experience loose stools or diarrhea, hold the medications until you stool forms back up.  If your symptoms do not get better within 1 week or if they get worse, check with your doctor.  If you experience "the worst abdominal pain ever" or develop nausea or vomiting, please contact the office immediately for further recommendations for treatment. ° ° °ITCHING:  If you experience itching with your medications, try taking only a single pain pill, or even half a pain pill at a time.  You can also use Benadryl over the counter for itching or also to   help with sleep.   TED HOSE STOCKINGS:  Use stockings on both legs until for at least 2 weeks or as directed by physician office. They may be removed at night for sleeping.  MEDICATIONS:  See your medication summary on the After Visit Summary that nursing will review with you.  You may have some home medications which will be placed on hold until you complete the course of blood thinner medication.  It is important for you to complete the blood thinner medication as prescribed.  PRECAUTIONS:  If you experience chest pain or shortness of breath - call 911 immediately for transfer to the hospital emergency department.   If you develop a fever greater that 101 F, purulent drainage from wound, increased redness or drainage from wound, foul odor from the wound/dressing, or calf pain - CONTACT YOUR SURGEON.                                                   FOLLOW-UP APPOINTMENTS:  If you do not already have a post-op appointment, please call the office for an appointment to be seen by your surgeon.  Guidelines for how soon to be seen are listed in your After Visit Summary, but are typically between 1-4 weeks after surgery.  OTHER INSTRUCTIONS:   Knee  Replacement:  Do not place pillow under knee, focus on keeping the knee straight while resting. CPM instructions: 0-90 degrees, 2 hours in the morning, 2 hours in the afternoon, and 2 hours in the evening. Place foam block, curve side up under heel at all times except when in CPM or when walking.  DO NOT modify, tear, cut, or change the foam block in any way.  MAKE SURE YOU:   Understand these instructions.   Get help right away if you are not doing well or get worse.    Thank you for letting us be a part of your medical care team.  It is a privilege we respect greatly.  We hope these instructions will help you stay on track for a fast and full recovery!   Information on my medicine - XARELTO (Rivaroxaban)  This medication education was reviewed with me or my healthcare representative as part of my discharge preparation.  Why was Xarelto prescribed for you? Xarelto was prescribed for you to reduce the risk of blood clots forming after orthopedic surgery and to prevent stroke due to atrial fibrillation. The medical term for these abnormal blood clots is venous thromboembolism (VTE).  What do you need to know about xarelto ? Take your Xarelto ONCE DAILY at the same time every day. You may take it either with or without food.  If you have difficulty swallowing the tablet whole, you may crush it and mix in applesauce just prior to taking your dose.  Take Xarelto exactly as prescribed by your doctor and DO NOT stop taking Xarelto without talking to the doctor who prescribed the medication.  Stopping without other VTE prevention medication to take the place of Xarelto may increase your risk of developing a clot.  After discharge, you should have regular check-up appointments with your healthcare provider that is prescribing your Xarelto.    What do you do if you miss a dose? If you miss a dose, take it as soon as you remember on the same day then continue your regularly  scheduled once  daily regimen the next day. Do not take two doses of Xarelto on the same day.   Important Safety Information A possible side effect of Xarelto is bleeding. You should call your healthcare provider right away if you experience any of the following: ? Bleeding from an injury or your nose that does not stop. ? Unusual colored urine (red or dark brown) or unusual colored stools (red or black). ? Unusual bruising for unknown reasons. ? A serious fall or if you hit your head (even if there is no bleeding).  Some medicines may interact with Xarelto and might increase your risk of bleeding while on Xarelto. To help avoid this, consult your healthcare provider or pharmacist prior to using any new prescription or non-prescription medications, including herbals, vitamins, non-steroidal anti-inflammatory drugs (NSAIDs) and supplements.  This website has more information on Xarelto: [email or web address]

## 2017-03-11 NOTE — Evaluation (Signed)
Physical Therapy Evaluation Patient Details Name: Bradley Roberson MRN: [id number] DOB: Jun 03, 1940 Today's Date: 03/11/2017   History of Present Illness  Pt is a 76 y/o male s/p elective L THA, direct anterior approach. PMH includes HTN, afib, and s/p cardioversion.   Clinical Impression  Pt is s/p surgery above with deficits below. PTA, pt was independent with functional mobility. Upon eval, pt presenting with post op pain and weakness, and also experienced nausea during gait training which limited gait tolerance. Notified RN about nausea. Pt requiring min to min guard assist for mobility with RW. Pt reports wife and brother will be able to assist at d/c and will need DME below. Anticipate pt will progress well once nausea controlled. Follow up recommendations per MD arrangements. Will continue to follow acutely to maximize functional mobility independence and safety.     Follow Up Recommendations DC plan and follow up therapy as arranged by surgeon;Supervision for mobility/OOB    Equipment Recommendations  Rolling walker with 5" wheels;3in1 (PT)    Recommendations for Other Services       Precautions / Restrictions Precautions Precautions: None Precaution Comments: Reviewed supine THA ther ex with pt.  Restrictions Weight Bearing Restrictions: Yes RLE Weight Bearing: Weight bearing as tolerated      Mobility  Bed Mobility Overal bed mobility: Needs Assistance Bed Mobility: Supine to Sit     Supine to sit: Supervision     General bed mobility comments: Supervision for safety.   Transfers Overall transfer level: Needs assistance Equipment used: Rolling walker (2 wheeled) Transfers: Sit to/from Stand Sit to Stand: Min assist         General transfer comment: Min A for lift assist and steadying assist. Verbal cues for safe hand placement.   Ambulation/Gait Ambulation/Gait assistance: Min guard Ambulation Distance (Feet): 5 Feet Assistive device: Rolling walker (2  wheeled) Gait Pattern/deviations: Step-to pattern;Decreased step length - right;Decreased step length - left;Decreased weight shift to right;Antalgic Gait velocity: Decreased Gait velocity interpretation: Below normal speed for age/gender General Gait Details: Slow, antalgic gait. Verbal cues for sequencing using RW. Pt with increased nausea with short gait distance, so distance limited to chair this session. RN notified.   Stairs            Wheelchair Mobility    Modified Rankin (Stroke Patients Only)       Balance Overall balance assessment: Needs assistance Sitting-balance support: No upper extremity supported;Feet supported Sitting balance-Leahy Scale: Good     Standing balance support: Bilateral upper extremity supported;During functional activity Standing balance-Leahy Scale: Poor Standing balance comment: Reliant on RW for stability.                              Pertinent Vitals/Pain Pain Assessment: 0-10 Pain Score: 4  Pain Location: R hip  Pain Descriptors / Indicators: Aching;Operative site guarding Pain Intervention(s): Limited activity within patient's tolerance;Monitored during session;Repositioned    Home Living Family/patient expects to be discharged to:: Private residence Living Arrangements: Spouse/significant other Available Help at Discharge: Family;Available 24 hours/day Type of Home: House Home Access: Stairs to enter Entrance Stairs-Rails: None Entrance Stairs-Number of Steps: 1(threshold ) Home Layout: Two level Home Equipment: None      Prior Function Level of Independence: Independent               Hand Dominance   Dominant Hand: Right    Extremity/Trunk Assessment   Upper Extremity Assessment Upper Extremity Assessment:  Overall [facility] for tasks assessed    Lower Extremity Assessment Lower Extremity Assessment: RLE deficits/detail RLE Deficits / Details: Sensory in tact. Deficits consistent with post op pain and  weakness. able to perform ther ex below.     Cervical / Trunk Assessment Cervical / Trunk Assessment: Normal  Communication   Communication: No difficulties  Cognition Arousal/Alertness: Awake/alert Behavior During Therapy: WFL for tasks assessed/performed Overall Cognitive Status: Within Functional Limits for tasks assessed                                        General Comments General comments (skin integrity, edema, etc.): Pt's wife present during session.     Exercises Total Joint Exercises Ankle Circles/Pumps: AROM;Both;20 reps Quad Sets: AROM;Right;10 reps Short Arc Quad: AROM;Right;10 reps Heel Slides: AROM;Right;10 reps Hip ABduction/ADduction: AROM;Right;10 reps   Assessment/Plan    PT Assessment Patient needs continued PT services  PT Problem List Decreased strength;Decreased range of motion;Decreased balance;Decreased mobility;Decreased knowledge of use of DME;Pain       PT Treatment Interventions DME instruction;Gait training;Therapeutic activities;Functional mobility training;Stair training;Therapeutic exercise;Balance training;Neuromuscular re-education;Patient/family education    PT Goals (Current goals can be found in the Care Plan section)  Acute Rehab PT Goals Patient Stated Goal: to go home tomorrow  PT Goal Formulation: With patient Time For Goal Achievement: 03/18/17 Potential to Achieve Goals: Good    Frequency 7X/week   Barriers to discharge        Co-evaluation               AM-PAC PT "6 Clicks" Daily Activity  Outcome Measure Difficulty turning over in bed (including adjusting bedclothes, sheets and blankets)?: None Difficulty moving from lying on back to sitting on the side of the bed? : A Little Difficulty sitting down on and standing up from a chair with arms (e.g., wheelchair, bedside commode, etc,.)?: Unable Help needed moving to and from a bed to chair (including a wheelchair)?: A Little Help needed walking in  hospital room?: A Little Help needed climbing 3-5 steps with a railing? : A Little 6 Click Score: 17    End of Session Equipment Utilized During Treatment: Gait belt Activity Tolerance: Treatment limited secondary to medical complications (Comment)(nausea ) Patient left: in chair;with call bell/phone within reach Nurse Communication: Mobility status;Other (comment)(nausea) PT Visit Diagnosis: Other abnormalities of gait and mobility (R26.89);Pain Pain - Right/Left: Right Pain - part of body: Hip    Time: 1141-1207 PT Time Calculation (min) (ACUTE ONLY): 26 min   Charges:   PT Evaluation $PT Eval Low Complexity: 1 Low PT Treatments $Therapeutic Exercise: 8-22 mins   PT G Codes:        Leighton Ruff, PT, DPT  Acute Rehabilitation Services  Pager: [phone]   Rudean Hitt 03/11/2017, 12:18 PM

## 2017-03-11 NOTE — Op Note (Signed)
OPERATIVE REPORT    DATE OF PROCEDURE:  03/11/2017       PREOPERATIVE DIAGNOSIS:  RIGHT HIP OSTEOARTHRITIS                                                          POSTOPERATIVE DIAGNOSIS:  RIGHT HIP OSTEOARTHRITIS                                                           PROCEDURE: Anterior R total hip arthroplasty using a 58 mm DePuy Pinnacle  Cup, [employer], 0-degree polyethylene liner, a +1.5 36 mm ceramic head, a 9 hi Depuy Triloc stem   SURGEON: Kerin Salen    ASSISTANT:   Kerry Hough. [employer]  (present throughout entire procedure and necessary for timely completion of the procedure)   ANESTHESIA: Spinal BLOOD LOSS: 400cc FLUID REPLACEMENT: 1600 crystalloid Antibiotic: 2gm ancef Tranexamic Acid: 1gm IV, 2gm Topical COMPLICATIONS: none    INDICATIONS FOR PROCEDURE: A 76 y.o. year-old With  RIGHT HIP OSTEOARTHRITIS   for 3 years, x-rays show bone-on-bone arthritic changes, and osteophytes. Despite conservative measures with observation, anti-inflammatory medicine, narcotics, use of a cane, has severe unremitting pain and can ambulate only a few blocks before resting. Patient desires elective R total hip arthroplasty to decrease pain and increase function. The risks, benefits, and alternatives were discussed at length including but not limited to the risks of infection, bleeding, nerve injury, stiffness, blood clots, the need for revision surgery, cardiopulmonary complications, among others, and they were willing to proceed. Questions answered     PROCEDURE IN DETAIL: The patient was identified by armband,  received preoperative IV antibiotics in the holding area at [facility], taken to the operating room , appropriate anesthetic monitors  were attached and  anesthesia was induced with the patienton the gurney. The HANA boots were applied to the feet and he was then transferred to the HANA table with a peroneal post and support underneath the non-operative  le, which was locked in 5 lb traction. Theoperative lower extremity was then prepped and draped in the usual sterile fashion from just above the iliac crest to the knee. And a timeout procedure was performed. We then made a 12 cm incision along the interval at the leading edge of the tensor fascia lata of starting at 2 cm lateral to and 2 cm distal to the ASIS. Small bleeders in the skin and subcutaneous tissue identified and cauterized we dissected down to the fascia and made an incision in the fascia allowing [place] to elevate the fascia of the tensor muscle and exploited the interval between the rectus and the tensor fascia lata. A Hohmann retractor was then placed along the superior neck of the femur and a Cobra retractor along the inferior neck of the femur we teed the capsule starting out at the superior anterior aspect of the acetabulum going distally and made the T along the neck both leaflets of the T were tagged with #2 Ethibond suture. Cobra retractors were then placed along the inferior and superior neck allowing [place] to perform a standard neck cut  and removed the femoral head with a power corkscrew. We then placed a right angle Hohmann retractor along the anterior aspect of the acetabulum a spiked Cobra in the cotyloid notch and posteriorly a Muelller retractor. We then sequentially reamed up to a 57 mm basket reamer obtaining good coverage in all quadrants, verified by C-arm imaging. Under C-arm control with and hammered into place a 58 mm Pinnacle cup in 45 of abduction and 15 of anteversion. The cup seated nicely and required no supplemental screws. We then placed a central hole Eliminator and a 0 polyethylene liner. The foot was then externally rotated to 110, the HANA elevator was placed around the flare of the greater trochanter and the limb was extended and abducted delivering the proximal femur up into the wound. A medium Hohmann retractor was placed over the greater trochanter and a Mueller  retractor along the posterior femoral neck completing the exposure. We then performed releases superiorly and and inferiorly of the capsule going back to the pirformis fossa superiorly and to the lesser trochanter inferiorly. We then entered the proximal femur with the box cutting offset chisel followed by, a canal sounder, the chili pepper and broaching up to a 9 broach. This seated nicely and we reamed the calcar. A trial reduction was performed with a 1.5 mm 36 mm head.The limb lengths were excellent the hip was stable in 90 of external rotation. At this point the trial components removed and we hammered into place a # 9 hi  Offset Tri-Lock stem with Gryption coating. A + 1.5 36 mm ceramic ball was then hammered into place the hip was reduced and final C-arm images obtained. The wound was thoroughly irrigated with normal saline solution. We repaired the ant capsule and the tensor fascia lot a with running 0 vicryl suture. the subcutaneous tissue was closed with 2-0 and 3-0 Vicryl suture followed by an Aquacil dressing. At this point the patient was awaken and transferred to hospital gurney without difficulty. The subcutaneous tissue with 0 and 2-0 undyed Vicryl suture and the skin with running  3-0 vicryl subcuticular suture. Aquacil dressing was applied. The patient was then unclamped, rolled supine, awaken extubated and taken to recovery room without difficulty in stable condition.   Kerin Salen 03/11/2017, 8:54 AM

## 2017-03-11 NOTE — Transfer of Care (Signed)
Immediate Anesthesia Transfer of Care Note  Patient: TREVONE PRESTWOOD  Procedure(s) Performed: RIGHT TOTAL HIP ARTHROPLASTY ANTERIOR APPROACH (Right Hip)  Patient Location: PACU  Anesthesia Type:Spinal  Level of Consciousness: awake, alert  and oriented  Airway & Oxygen Therapy: Patient Spontanous Breathing and Patient connected to nasal cannula oxygen  Post-op Assessment: Report given to RN and Post -op Vital signs reviewed and stable  Post vital signs: Reviewed and stable  Last Vitals:  Vitals:   03/11/17 0600  BP: (!) 140/95  Pulse: 72  Resp: 20  Temp: 37.2 C  SpO2: 99%    Last Pain:  Vitals:   03/11/17 0635  TempSrc:   PainSc: 4       Patients Stated Pain Goal: 2 (78/41/28 2081)  Complications: No apparent anesthesia complications

## 2017-03-11 NOTE — Anesthesia Procedure Notes (Signed)
Spinal  Patient location during procedure: OR Start time: 03/11/2017 7:35 AM End time: 03/11/2017 7:42 AM Staffing Anesthesiologist: Audry Pili, MD Performed: anesthesiologist  Preanesthetic Checklist Completed: patient identified, surgical consent, pre-op evaluation, timeout performed, IV checked, risks and benefits discussed and monitors and equipment checked Spinal Block Patient position: sitting Prep: DuraPrep Patient monitoring: heart rate, cardiac monitor, continuous pulse ox and blood pressure Approach: midline Location: L2-3 Injection technique: single-shot Needle Needle type: Quincke  Needle gauge: 22 G Additional Notes Functioning IV was confirmed and monitors were applied. Sterile prep and drape, including hand hygiene, mask, and sterile gloves were used. The patient was positioned and the spine was prepped. The skin was anesthetized with lidocaine. First attempt at L3-4 with 24ga Pencan unsuccessful. Second attempt with 22ga Quincke successful at L2-3. Free flow of clear CSF was obtained prior to injecting local anesthetic into the CSF. The spinal needle aspirated freely following injection. The needle was carefully withdrawn. The patient tolerated the procedure well. Consent was obtained prior to the procedure with all questions answered and concerns addressed. Risks including, but not limited to, bleeding, infection, nerve damage, paralysis, failed block, inadequate analgesia, allergic reaction, high spinal, itching, and headache were discussed and the patient wished to proceed.  Renold Don, MD

## 2017-03-12 ENCOUNTER — Encounter (HOSPITAL_COMMUNITY): Payer: Self-pay | Admitting: Orthopedic Surgery

## 2017-03-12 ENCOUNTER — Other Ambulatory Visit: Payer: Self-pay

## 2017-03-12 LAB — CBC
HCT: 33.6 % — ABNORMAL LOW (ref 39.0–52.0)
Hemoglobin: 11.8 g/dL — ABNORMAL LOW (ref 13.0–17.0)
MCH: 32.4 pg (ref 26.0–34.0)
MCHC: 35.1 g/dL (ref 30.0–36.0)
MCV: 92.3 fL (ref 78.0–100.0)
Platelets: 197 10*3/uL (ref 150–400)
RBC: 3.64 MIL/uL — ABNORMAL LOW (ref 4.22–5.81)
RDW: 12.5 % (ref 11.5–15.5)
WBC: 8.9 10*3/uL (ref 4.0–10.5)

## 2017-03-12 NOTE — Progress Notes (Addendum)
PATIENT ID: Bradley Roberson  MRN: [id number]  DOB/AGE:  07/15/1940 / 76 y.o.  1 Day Post-Op Procedure(s) (LRB): RIGHT TOTAL HIP ARTHROPLASTY ANTERIOR APPROACH (Right)    PROGRESS NOTE Subjective: Patient is alert, oriented, no Nausea, no Vomiting, yes passing gas, . Taking PO well. Denies SOB, Chest or Calf Pain. Using Incentive Spirometer, PAS in place. Ambulate in room without difficulty and in hallway Patient reports pain as 2/10  .    Objective: Vital signs in last 24 hours: Vitals:   03/11/17 1453 03/11/17 2130 03/12/17 0047 03/12/17 0542  BP: 128/78 117/64 (!) 112/57 116/80  Pulse: 71 83 86 69  Resp:  17 15 18   Temp: 97.6 F (36.4 C) 98 F (36.7 C) 97.6 F (36.4 C) 97.6 F (36.4 C)  TempSrc: Oral Oral Oral Oral  SpO2: 100% 97% 98% 97%  Weight:      Height:          Intake/Output from previous day: I/O last 3 completed shifts: In: 2690 [P.O.:240; I.V.:2350; IV Piggyback:100] Out: 1500 [Urine:1100; Blood:400]   Intake/Output this shift: No intake/output data recorded.   LABORATORY DATA: Recent Labs    03/11/17 0640 03/12/17 0640  WBC  --  8.9  HGB  --  11.8*  HCT  --  33.6*  PLT  --  197  INR 1.08  --     Examination: Neurologically intact ABD soft Neurovascular intact Sensation intact distally Intact pulses distally Dorsiflexion/Plantar flexion intact Incision: dressing C/D/I No cellulitis present Compartment soft} XR AP&Lat of hip shows well placed\fixed THA  Assessment:   1 Day Post-Op Procedure(s) (LRB): RIGHT TOTAL HIP ARTHROPLASTY ANTERIOR APPROACH (Right) ADDITIONAL DIAGNOSIS:  Expected Acute Blood Loss Anemia,   Plan: PT/OT WBAT, THA  DVT Prophylaxis: SCDx72 hrs, will restart Xarelto at 10 mg twice daily for 2 weeks and then resume normal dosing  DISCHARGE PLAN: Home, today if patient meets all PT goals otherwise tomorrow  DISCHARGE NEEDS: HHPT, Walker and 3-in-1 comode seat

## 2017-03-12 NOTE — Progress Notes (Signed)
Physical Therapy Treatment Patient Details Name: Bradley Roberson MRN: [id number] DOB: [birth year] Today's Date: 03/12/2017    History of Present Illness Pt is a 76 y/o male s/p elective L THA, direct anterior approach. PMH includes HTN, afib, and s/p cardioversion.     PT Comments    Patient is making good progress with PT.  From a mobility standpoint anticipate patient will be ready for DC home when medically ready.    Follow Up Recommendations  DC plan and follow up therapy as arranged by surgeon;Supervision for mobility/OOB     Equipment Recommendations  Rolling walker with 5" wheels;3in1 (PT)    Recommendations for Other Services       Precautions / Restrictions Precautions Precautions: None Restrictions Weight Bearing Restrictions: Yes RLE Weight Bearing: Weight bearing as tolerated    Mobility  Bed Mobility               General bed mobility comments: pt OOB in chair upon arrival  Transfers Overall transfer level: Needs assistance Equipment used: Rolling walker (2 wheeled) Transfers: Sit to/from Stand Sit to Stand: Supervision         General transfer comment: supervision for safety  Ambulation/Gait Ambulation/Gait assistance: Supervision Ambulation Distance (Feet): 300 Feet Assistive device: Rolling walker (2 wheeled) Gait Pattern/deviations: Antalgic;Step-through pattern;Decreased weight shift to right Gait velocity: Decreased   General Gait Details: cues for posture; good step length symmetry and increased weight bearing on R LE   Stairs Stairs: Yes   Stair Management: One rail Left;Step to pattern;Forwards Number of Stairs: 10 General stair comments: cues for sequencing and technique with carry over demonstrated from previous session  Wheelchair Mobility    Modified Rankin (Stroke Patients Only)       Balance Overall balance assessment: Needs assistance Sitting-balance support: No upper extremity supported;Feet supported Sitting  balance-Leahy Scale: Good     Standing balance support: No upper extremity supported Standing balance-Leahy Scale: Fair Standing balance comment: pt able to static stand without UE support                            Cognition Arousal/Alertness: Awake/alert Behavior During Therapy: WFL for tasks assessed/performed Overall Cognitive Status: Within Functional Limits for tasks assessed                                        Exercises Total Joint Exercises Quad Sets: AROM;10 reps;Both Short Arc Quad: AROM;Right;10 reps Heel Slides: AROM;Right;10 reps Hip ABduction/ADduction: AROM;Right;10 reps;Standing Long Arc Quad: AROM;Right;10 reps Knee Flexion: AROM;Right;10 reps;Standing Marching in Standing: AROM;Right;10 reps;Standing Standing Hip Extension: AROM;Right;10 reps;Standing    General Comments        Pertinent Vitals/Pain Pain Assessment: Faces Pain Score: 5  Faces Pain Scale: Hurts a little bit Pain Location: R hip  Pain Descriptors / Indicators: Sore Pain Intervention(s): Monitored during session;Repositioned;Limited activity within patient's tolerance    Home Living Family/patient expects to be discharged to:: Private residence Living Arrangements: Spouse/significant other                  Prior Function            PT Goals (current goals can now be found in the care plan section) Acute Rehab PT Goals PT Goal Formulation: With patient Time For Goal Achievement: 03/18/17 Potential to Achieve Goals: Good Progress towards PT goals: Progressing  toward goals    Frequency    7X/week      PT Plan Current plan remains appropriate    Co-evaluation              AM-PAC PT "6 Clicks" Daily Activity  Outcome Measure  Difficulty turning over in bed (including adjusting bedclothes, sheets and blankets)?: None Difficulty moving from lying on back to sitting on the side of the bed? : A Little Difficulty sitting down on and  standing up from a chair with arms (e.g., wheelchair, bedside commode, etc,.)?: Unable Help needed moving to and from a bed to chair (including a wheelchair)?: None Help needed walking in hospital room?: A Little Help needed climbing 3-5 steps with a railing? : A Little 6 Click Score: 18    End of Session Equipment Utilized During Treatment: Gait belt Activity Tolerance: Patient tolerated treatment well Patient left: in chair;with call bell/phone within reach;with family/visitor present Nurse Communication: Mobility status PT Visit Diagnosis: Other abnormalities of gait and mobility (R26.89);Pain Pain - Right/Left: Right Pain - part of body: Hip     Time: 3016-0109 PT Time Calculation (min) (ACUTE ONLY): 17 min  Charges:  $Gait Training: 8-22 mins $Therapeutic Exercise: 8-22 mins                    G Codes:       Earney Navy, PTA Pager: [phone]     Darliss Cheney 03/12/2017, 1:32 PM

## 2017-03-12 NOTE — Progress Notes (Signed)
Physical Therapy Treatment Patient Details Name: Bradley Roberson MRN: [id number] DOB: 06-30-1940 Today's Date: 03/12/2017    History of Present Illness Pt is a 76 y/o male s/p elective L THA, direct anterior approach. PMH includes HTN, afib, and s/p cardioversion.     PT Comments    Patient is progressing well toward mobility goals and able to safely navigate stairs. Pt overall required supervision/min guard for OOB mobility. Continue to progress as tolerated.    Follow Up Recommendations  DC plan and follow up therapy as arranged by surgeon;Supervision for mobility/OOB     Equipment Recommendations  Rolling walker with 5" wheels;3in1 (PT)    Recommendations for Other Services       Precautions / Restrictions Precautions Precautions: None Restrictions Weight Bearing Restrictions: Yes RLE Weight Bearing: Weight bearing as tolerated    Mobility  Bed Mobility               General bed mobility comments: pt OOB in chair upon arrival  Transfers Overall transfer level: Needs assistance Equipment used: Rolling walker (2 wheeled) Transfers: Sit to/from Stand Sit to Stand: Supervision         General transfer comment: supervision for safety  Ambulation/Gait Ambulation/Gait assistance: Supervision Ambulation Distance (Feet): 200 Feet Assistive device: Rolling walker (2 wheeled) Gait Pattern/deviations: Decreased weight shift to right;Antalgic;Step-through pattern Gait velocity: Decreased   General Gait Details: cues for posture and sequencing   Stairs Stairs: Yes   Stair Management: One rail Left;Step to pattern;Forwards Number of Stairs: 10 General stair comments: cues for sequencing and technique  Wheelchair Mobility    Modified Rankin (Stroke Patients Only)       Balance Overall balance assessment: Needs assistance Sitting-balance support: No upper extremity supported;Feet supported Sitting balance-Leahy Scale: Good     Standing balance  support: No upper extremity supported Standing balance-Leahy Scale: Fair Standing balance comment: pt able to static stand without UE support                            Cognition Arousal/Alertness: Awake/alert Behavior During Therapy: WFL for tasks assessed/performed Overall Cognitive Status: Within Functional Limits for tasks assessed                                        Exercises Total Joint Exercises Quad Sets: AROM;10 reps;Both Short Arc Quad: AROM;Right;10 reps Heel Slides: AROM;Right;10 reps Hip ABduction/ADduction: AROM;Right;10 reps Long Arc Quad: AROM;Right;10 reps    General Comments        Pertinent Vitals/Pain Pain Assessment: 0-10 Pain Score: 5  Pain Location: R hip  Pain Descriptors / Indicators: Grimacing;Sore Pain Intervention(s): Monitored during session;Premedicated before session;Repositioned    Home Living                      Prior Function            PT Goals (current goals can now be found in the care plan section) Acute Rehab PT Goals PT Goal Formulation: With patient Time For Goal Achievement: 03/18/17 Potential to Achieve Goals: Good Progress towards PT goals: Progressing toward goals    Frequency    7X/week      PT Plan Current plan remains appropriate    Co-evaluation              AM-PAC PT "6 Clicks" Daily Activity  Outcome Measure  Difficulty turning over in bed (including adjusting bedclothes, sheets and blankets)?: None Difficulty moving from lying on back to sitting on the side of the bed? : A Little Difficulty sitting down on and standing up from a chair with arms (e.g., wheelchair, bedside commode, etc,.)?: Unable Help needed moving to and from a bed to chair (including a wheelchair)?: A Little Help needed walking in hospital room?: A Little Help needed climbing 3-5 steps with a railing? : A Little 6 Click Score: 17    End of Session Equipment Utilized During Treatment:  Gait belt Activity Tolerance: Patient tolerated treatment well Patient left: in chair;with call bell/phone within reach Nurse Communication: Mobility status PT Visit Diagnosis: Other abnormalities of gait and mobility (R26.89);Pain Pain - Right/Left: Right Pain - part of body: Hip     Time: 9163-8466 PT Time Calculation (min) (ACUTE ONLY): 27 min  Charges:  $Gait Training: 8-22 mins $Therapeutic Exercise: 8-22 mins                    G Codes:       Earney Navy, PTA Pager: [phone]     Darliss Cheney 03/12/2017, 9:49 AM

## 2017-03-13 LAB — CBC
HEMATOCRIT: 32.9 % — AB (ref 39.0–52.0)
HEMOGLOBIN: 11.5 g/dL — AB (ref 13.0–17.0)
MCH: 32.4 pg (ref 26.0–34.0)
MCHC: 35 g/dL (ref 30.0–36.0)
MCV: 92.7 fL (ref 78.0–100.0)
Platelets: 195 10*3/uL (ref 150–400)
RBC: 3.55 MIL/uL — ABNORMAL LOW (ref 4.22–5.81)
RDW: 12.6 % (ref 11.5–15.5)
WBC: 9.4 10*3/uL (ref 4.0–10.5)

## 2017-03-13 NOTE — Progress Notes (Deleted)
PATIENT ID: DECKER COGDELL  MRN: [id number]  DOB/AGE:  18-Nov-1940 / 76 y.o.  2 Days Post-Op Procedure(s) (LRB): RIGHT TOTAL HIP ARTHROPLASTY ANTERIOR APPROACH (Right)    PROGRESS NOTE Subjective: Patient is alert, oriented, no Nausea, no Vomiting, yes passing gas. Taking PO well. Denies SOB, Chest or Calf Pain. Using Incentive Spirometer, PAS in place. Ambulate WBAT with pt walking 180 ft with therapy, Patient reports pain as moderate .    Objective: Vital signs in last 24 hours: Vitals:   03/12/17 0847 03/12/17 1500 03/12/17 2210 03/13/17 0609  BP: 134/69 121/66 123/78 129/70  Pulse:  77 82 62  Resp:  18 17 18   Temp:  97.8 F (36.6 C) 98.2 F (36.8 C) 98.3 F (36.8 C)  TempSrc:  Oral Oral Oral  SpO2:  97% 100% 100%  Weight:      Height:          Intake/Output from previous day: I/O last 3 completed shifts: In: 1120 [P.O.:620; I.V.:500] Out: 1850 [Urine:1850]   Intake/Output this shift: No intake/output data recorded.   LABORATORY DATA: Recent Labs    03/11/17 0640 03/12/17 0640 03/13/17 0550  WBC  --  8.9 9.4  HGB  --  11.8* 11.5*  HCT  --  33.6* 32.9*  PLT  --  197 195  INR 1.08  --   --     Examination: Neurologically intact Neurovascular intact Sensation intact distally Intact pulses distally Dorsiflexion/Plantar flexion intact Incision: dressing C/D/I and no drainage No cellulitis present Compartment soft}  Assessment:   2 Days Post-Op Procedure(s) (LRB): RIGHT TOTAL HIP ARTHROPLASTY ANTERIOR APPROACH (Right) ADDITIONAL DIAGNOSIS: Expected Acute Blood Loss Anemia,   Plan: PT/OT WBAT, AROM and PROM  DVT Prophylaxis:  SCDx72hrs, ASA 325 mg BID x 2 weeks DISCHARGE PLAN: Home, today DISCHARGE NEEDS: HHPT, Walker and 3-in-1 comode seat     PHILLIPS, ERIC R 03/13/2017, 7:50 AM

## 2017-03-13 NOTE — Progress Notes (Addendum)
PATIENT ID: Bradley Roberson  MRN: [id number]  DOB/AGE:  September 18, 1940 / 76 y.o.  2 Days Post-Op Procedure(s) (LRB): RIGHT TOTAL HIP ARTHROPLASTY ANTERIOR APPROACH (Right)    PROGRESS NOTE Subjective: Patient is alert, oriented, no Nausea, no Vomiting, yes passing gas, . Taking PO well. Denies SOB, Chest or Calf Pain. Using Incentive Spirometer, PAS in place. Ambulate WBAT with pt walking 300 ft with therapy Patient reports pain as  3/10  .    Objective: Vital signs in last 24 hours: Vitals:   03/12/17 0847 03/12/17 1500 03/12/17 2210 03/13/17 0609  BP: 134/69 121/66 123/78 129/70  Pulse:  77 82 62  Resp:  18 17 18   Temp:  97.8 F (36.6 C) 98.2 F (36.8 C) 98.3 F (36.8 C)  TempSrc:  Oral Oral Oral  SpO2:  97% 100% 100%  Weight:      Height:          Intake/Output from previous day: I/O last 3 completed shifts: In: 1120 [P.O.:620; I.V.:500] Out: 1850 [Urine:1850]   Intake/Output this shift: No intake/output data recorded.   LABORATORY DATA: Recent Labs    03/11/17 0640 03/12/17 0640 03/13/17 0550  WBC  --  8.9 9.4  HGB  --  11.8* 11.5*  HCT  --  33.6* 32.9*  PLT  --  197 195  INR 1.08  --   --     Examination: Neurologically intact Neurovascular intact Sensation intact distally Intact pulses distally Dorsiflexion/Plantar flexion intact Incision: dressing C/D/I No cellulitis present Compartment soft} XR AP&Lat of hip shows well placed\fixed THA  Assessment:   2 Days Post-Op Procedure(s) (LRB): RIGHT TOTAL HIP ARTHROPLASTY ANTERIOR APPROACH (Right) ADDITIONAL DIAGNOSIS:  Expected Acute Blood Loss Anemia,    Plan: PT/OT WBAT, THA  DVT Prophylaxis: SCDx72 hrs,  Xarelto 10 mg x 2 weeks  DISCHARGE PLAN: Home  DISCHARGE NEEDS: HHPT, Walker and 3-in-1 comode seat

## 2017-03-13 NOTE — Progress Notes (Signed)
Discharge instructions reviewed with pt and wife.  Copy of instructions and scripts given to pt/wife.   Pt d/c'd via wheelchair with belongings, with wife and brother.             Escorted by unit staff.

## 2017-03-13 NOTE — Progress Notes (Signed)
Physical Therapy Treatment Patient Details Name: Bradley Roberson MRN: [id number] DOB: 01/17/1941 Today's Date: 03/13/2017    History of Present Illness Pt is a 76 y/o male s/p elective L THA, direct anterior approach. PMH includes HTN, afib, and s/p cardioversion.     PT Comments    Patient continues to make good progress with mobility. Pt was overall supervision/mod I for mobility and demonstrated understanding of HEP. Current plan remains appropriate.    Follow Up Recommendations  DC plan and follow up therapy as arranged by surgeon;Supervision for mobility/OOB     Equipment Recommendations  Rolling walker with 5" wheels;3in1 (PT)    Recommendations for Other Services       Precautions / Restrictions Precautions Precautions: None Restrictions Weight Bearing Restrictions: Yes RLE Weight Bearing: Weight bearing as tolerated    Mobility  Bed Mobility               General bed mobility comments: pt OOB in chair upon arrival  Transfers Overall transfer level: Modified independent Equipment used: Rolling walker (2 wheeled) Transfers: Sit to/from Stand              Ambulation/Gait Ambulation/Gait assistance: Supervision Ambulation Distance (Feet): 350 Feet Assistive device: Rolling walker (2 wheeled) Gait Pattern/deviations: Step-through pattern;Trunk flexed     General Gait Details: cues for posture and proximity to [employer]            Wheelchair Mobility    Modified Rankin (Stroke Patients Only)       Balance Overall balance assessment: Needs assistance Sitting-balance support: No upper extremity supported;Feet supported Sitting balance-Leahy Scale: Good     Standing balance support: No upper extremity supported Standing balance-Leahy Scale: Fair Standing balance comment: pt able to static stand without UE support                            Cognition Arousal/Alertness: Awake/alert Behavior During Therapy: WFL for tasks  assessed/performed Overall Cognitive Status: Within Functional Limits for tasks assessed                                        Exercises Total Joint Exercises Quad Sets: AROM;Both;10 reps Short Arc Quad: AROM;Right;10 reps Heel Slides: AROM;Right;10 reps Hip ABduction/ADduction: AROM;Right;10 reps Long Arc Quad: AROM;Right;10 reps    General Comments        Pertinent Vitals/Pain Pain Assessment: Faces Faces Pain Scale: Hurts a little bit Pain Location: R hip  Pain Descriptors / Indicators: Sore Pain Intervention(s): Monitored during session;Premedicated before session;Repositioned    Home Living                      Prior Function            PT Goals (current goals can now be found in the care plan section) Acute Rehab PT Goals PT Goal Formulation: With patient Time For Goal Achievement: 03/18/17 Potential to Achieve Goals: Good Progress towards PT goals: Progressing toward goals    Frequency    7X/week      PT Plan Current plan remains appropriate    Co-evaluation              AM-PAC PT "6 Clicks" Daily Activity  Outcome Measure  Difficulty turning over in bed (including adjusting bedclothes, sheets and blankets)?: None Difficulty moving from lying  on back to sitting on the side of the bed? : A Little Difficulty sitting down on and standing up from a chair with arms (e.g., wheelchair, bedside commode, etc,.)?: A Little Help needed moving to and from a bed to chair (including a wheelchair)?: None Help needed walking in hospital room?: A Little Help needed climbing 3-5 steps with a railing? : A Little 6 Click Score: 20    End of Session Equipment Utilized During Treatment: Gait belt Activity Tolerance: Patient tolerated treatment well Patient left: in chair;with call bell/phone within reach;with family/visitor present Nurse Communication: Mobility status PT Visit Diagnosis: Other abnormalities of gait and mobility  (R26.89);Pain Pain - Right/Left: Right Pain - part of body: Hip     Time: 3710-6269 PT Time Calculation (min) (ACUTE ONLY): 23 min  Charges:  $Gait Training: 8-22 mins $Therapeutic Exercise: 8-22 mins                    G Codes:       Bradley Roberson, PTA Pager: [phone]     Bradley Roberson 03/13/2017, 12:31 PM

## 2017-03-13 NOTE — Discharge Summary (Signed)
Patient ID: Bradley Roberson MRN: [id number] DOB/AGE: 08-01-1940 76 y.o.  Admit date: 03/11/2017 Discharge date: 03/13/2017  Admission Diagnoses:  Active Problems:   Primary osteoarthritis of right hip   Discharge Diagnoses:  Same  Past Medical History:  Diagnosis Date  . Atrial fibrillation ([place]) 08/2015  . 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 Erectile dysfunction   . Hypertension     Surgeries: Procedure(s): RIGHT TOTAL HIP ARTHROPLASTY ANTERIOR APPROACH on 03/11/2017   Consultants:   Discharged Condition: Improved  Hospital Course: Bradley Roberson is an 76 y.o. male who was admitted 03/11/2017 for operative treatment of<principal problem not specified>. Patient has severe unremitting pain that affects sleep, daily activities, and work/hobbies. After pre-op clearance the patient was taken to the operating room on 03/11/2017 and underwent  Procedure(s): RIGHT TOTAL HIP ARTHROPLASTY ANTERIOR APPROACH.    Patient was given perioperative antibiotics:  Anti-infectives (From admission, onward)   Start     Dose/Rate Route Frequency Ordered Stop   03/11/17 0700  ceFAZolin (ANCEF) IVPB 2g/100 mL premix     2 g 200 mL/hr over 30 Minutes Intravenous To ShortStay Surgical 03/08/17 0746 03/11/17 0733       Patient was given sequential compression devices, early ambulation, and chemoprophylaxis to prevent DVT.  Patient benefited maximally from hospital stay and there were no complications.    Recent vital signs:  Patient Vitals for the past 24 hrs:  BP Temp Temp src Pulse Resp SpO2  03/13/17 0609 129/70 98.3 F (36.8 C) Oral 62 18 100 %  03/12/17 2210 123/78 98.2 F (36.8 C) Oral 82 17 100 %  03/12/17 1500 121/66 97.8 F (36.6 C) Oral 77 18 97 %  03/12/17 0847 134/69 - - - - -     Recent laboratory studies:  Recent Labs    03/11/17 0640 03/12/17 0640 03/13/17 0550  WBC  --  8.9 9.4  HGB  --  11.8* 11.5*  HCT  --  33.6* 32.9*  PLT  --  197 195   INR 1.08  --   --      Discharge Medications:   Allergies as of 03/13/2017   No Known Allergies     Medication List    STOP taking these medications   acetaminophen 650 MG CR tablet Commonly known as:  TYLENOL   meloxicam 7.5 MG tablet Commonly known as:  MOBIC     TAKE these medications   amLODipine 5 MG tablet Commonly known as:  NORVASC Take 5 mg daily by mouth.   benazepril 40 MG tablet Commonly known as:  LOTENSIN Take 1 tablet (40 mg total) by mouth daily.   carvedilol 6.25 MG tablet Commonly known as:  COREG Take 1 tablet (6.25 mg total) by mouth 2 (two) times daily.   oxyCODONE-acetaminophen 5-325 MG tablet Commonly known as:  ROXICET Take 1 tablet by mouth every 4 (four) hours as needed.   rivaroxaban 10 MG Tabs tablet Commonly known as:  XARELTO Take 1 tablet (10 mg total) by mouth daily. What changed:    medication strength  how much to take  when to take this   sildenafil 20 MG tablet Commonly known as:  REVATIO Take 20 mg by mouth daily as needed (sexual activity).   tiZANidine 2 MG tablet Commonly known as:  ZANAFLEX Take 1 tablet (2 mg total) by mouth every 6 (six) hours as needed for muscle spasms.  Durable Medical Equipment  (From admission, onward)        Start     Ordered   03/11/17 1100  DME Walker rolling  Once    Question:  Patient needs a walker to treat with the following condition  Answer:  Status post right hip replacement   03/11/17 1059   03/11/17 1100  DME 3 n 1  Once     03/11/17 1059   03/11/17 1100  DME Bedside commode  Once    Question:  Patient needs a bedside commode to treat with the following condition  Answer:  Status post right hip replacement   03/11/17 1059      Diagnostic Studies: Dg Chest 2 View  Result Date: 02/28/2017 CLINICAL DATA:  Preoperative evaluation, history of atrial fibrillation EXAM: CHEST  2 VIEW COMPARISON:  None. FINDINGS: Cardiac shadow is mildly enlarged. The lungs  are well aerated bilaterally. No focal infiltrate or sizable effusion is seen. Mild degenerative changes of the thoracic spine are noted. No other focal abnormality is seen. IMPRESSION: No active cardiopulmonary disease. Electronically Signed   By: Inez Catalina M.D.   On: 02/28/2017 15:23   Dg C-arm 1-60 Min  Result Date: 03/11/2017 CLINICAL DATA:  Right anterior hip replacement EXAM: OPERATIVE RIGHT HIP (WITH PELVIS IF PERFORMED) 2 VIEWS TECHNIQUE: Fluoroscopic spot image(s) were submitted for interpretation post-operatively. COMPARISON:  None. FINDINGS: Changes of right hip replacement. No hardware or bony complicating feature. Normal AP alignment. IMPRESSION: Right hip replacement.  No visible complicating feature. Electronically Signed   By: Rolm Baptise M.D.   On: 03/11/2017 09:05   Dg Hip Operative Unilat With Pelvis Right  Result Date: 03/11/2017 CLINICAL DATA:  Right anterior hip replacement EXAM: OPERATIVE RIGHT HIP (WITH PELVIS IF PERFORMED) 2 VIEWS TECHNIQUE: Fluoroscopic spot image(s) were submitted for interpretation post-operatively. COMPARISON:  None. FINDINGS: Changes of right hip replacement. No hardware or bony complicating feature. Normal AP alignment. IMPRESSION: Right hip replacement.  No visible complicating feature. Electronically Signed   By: Rolm Baptise M.D.   On: 03/11/2017 09:05    Disposition: 81-Discharged to home/self-care with a planned acute care hospital inpt readmission  Discharge Instructions    Call MD / Call 911   Complete by:  As directed    If you experience chest pain or shortness of breath, CALL 911 and be transported to the hospital emergency room.  If you develope a fever above 101 F, pus (white drainage) or increased drainage or redness at the wound, or calf pain, call your surgeon's office.   Constipation Prevention   Complete by:  As directed    Drink plenty of fluids.  Prune juice may be helpful.  You may use a stool softener, such as Colace (over  the counter) 100 mg twice a day.  Use MiraLax (over the counter) for constipation as needed.   Diet - low sodium heart healthy   Complete by:  As directed    Driving restrictions   Complete by:  As directed    No driving for 2 weeks   Increase activity slowly as tolerated   Complete by:  As directed    Patient may shower   Complete by:  As directed    You may shower without a dressing once there is no drainage.  Do not wash over the wound.  If drainage remains, cover wound with plastic wrap and then shower.      Follow-up Information    [name], MD Follow  up.   Specialty:  Orthopedic Surgery Contact information: [place] [address] [phone]            Signed: Hardin Negus Fermin Yan R 03/13/2017, 7:52 AM

## 2017-03-15 DIAGNOSIS — Z471 Aftercare following joint replacement surgery: Secondary | ICD-10-CM | POA: Diagnosis not present

## 2017-03-15 DIAGNOSIS — I10 Essential (primary) hypertension: Secondary | ICD-10-CM | POA: Diagnosis not present

## 2017-03-15 DIAGNOSIS — I4891 Unspecified atrial fibrillation: Secondary | ICD-10-CM | POA: Diagnosis not present

## 2017-03-15 DIAGNOSIS — Z96641 Presence of right artificial hip joint: Secondary | ICD-10-CM | POA: Diagnosis not present

## 2017-03-18 DIAGNOSIS — R262 Difficulty in walking, not elsewhere classified: Secondary | ICD-10-CM | POA: Diagnosis not present

## 2017-03-18 DIAGNOSIS — M25551 Pain in right hip: Secondary | ICD-10-CM | POA: Diagnosis not present

## 2017-03-18 DIAGNOSIS — M25651 Stiffness of right hip, not elsewhere classified: Secondary | ICD-10-CM | POA: Diagnosis not present

## 2017-03-18 DIAGNOSIS — Z96641 Presence of right artificial hip joint: Secondary | ICD-10-CM | POA: Diagnosis not present

## 2017-03-20 DIAGNOSIS — M25651 Stiffness of right hip, not elsewhere classified: Secondary | ICD-10-CM | POA: Diagnosis not present

## 2017-03-20 DIAGNOSIS — R262 Difficulty in walking, not elsewhere classified: Secondary | ICD-10-CM | POA: Diagnosis not present

## 2017-03-20 DIAGNOSIS — M25551 Pain in right hip: Secondary | ICD-10-CM | POA: Diagnosis not present

## 2017-03-20 DIAGNOSIS — Z96641 Presence of right artificial hip joint: Secondary | ICD-10-CM | POA: Diagnosis not present

## 2017-03-26 DIAGNOSIS — M25651 Stiffness of right hip, not elsewhere classified: Secondary | ICD-10-CM | POA: Diagnosis not present

## 2017-03-26 DIAGNOSIS — M25551 Pain in right hip: Secondary | ICD-10-CM | POA: Diagnosis not present

## 2017-03-26 DIAGNOSIS — Z96641 Presence of right artificial hip joint: Secondary | ICD-10-CM | POA: Diagnosis not present

## 2017-03-26 DIAGNOSIS — R262 Difficulty in walking, not elsewhere classified: Secondary | ICD-10-CM | POA: Diagnosis not present

## 2017-03-27 DIAGNOSIS — R262 Difficulty in walking, not elsewhere classified: Secondary | ICD-10-CM | POA: Diagnosis not present

## 2017-03-27 DIAGNOSIS — M25551 Pain in right hip: Secondary | ICD-10-CM | POA: Diagnosis not present

## 2017-03-27 DIAGNOSIS — M25651 Stiffness of right hip, not elsewhere classified: Secondary | ICD-10-CM | POA: Diagnosis not present

## 2017-03-27 DIAGNOSIS — Z96641 Presence of right artificial hip joint: Secondary | ICD-10-CM | POA: Diagnosis not present

## 2017-04-02 DIAGNOSIS — M25651 Stiffness of right hip, not elsewhere classified: Secondary | ICD-10-CM | POA: Diagnosis not present

## 2017-04-02 DIAGNOSIS — M25551 Pain in right hip: Secondary | ICD-10-CM | POA: Diagnosis not present

## 2017-04-02 DIAGNOSIS — R262 Difficulty in walking, not elsewhere classified: Secondary | ICD-10-CM | POA: Diagnosis not present

## 2017-04-02 DIAGNOSIS — Z96641 Presence of right artificial hip joint: Secondary | ICD-10-CM | POA: Diagnosis not present

## 2017-04-04 DIAGNOSIS — Z96641 Presence of right artificial hip joint: Secondary | ICD-10-CM | POA: Diagnosis not present

## 2017-04-04 DIAGNOSIS — R262 Difficulty in walking, not elsewhere classified: Secondary | ICD-10-CM | POA: Diagnosis not present

## 2017-04-04 DIAGNOSIS — M25551 Pain in right hip: Secondary | ICD-10-CM | POA: Diagnosis not present

## 2017-04-04 DIAGNOSIS — M25651 Stiffness of right hip, not elsewhere classified: Secondary | ICD-10-CM | POA: Diagnosis not present

## 2017-04-17 DIAGNOSIS — M25651 Stiffness of right hip, not elsewhere classified: Secondary | ICD-10-CM | POA: Diagnosis not present

## 2017-04-17 DIAGNOSIS — R262 Difficulty in walking, not elsewhere classified: Secondary | ICD-10-CM | POA: Diagnosis not present

## 2017-04-17 DIAGNOSIS — M25551 Pain in right hip: Secondary | ICD-10-CM | POA: Diagnosis not present

## 2017-04-17 DIAGNOSIS — Z96641 Presence of right artificial hip joint: Secondary | ICD-10-CM | POA: Diagnosis not present

## 2017-04-22 DIAGNOSIS — M25651 Stiffness of right hip, not elsewhere classified: Secondary | ICD-10-CM | POA: Diagnosis not present

## 2017-04-22 DIAGNOSIS — M25551 Pain in right hip: Secondary | ICD-10-CM | POA: Diagnosis not present

## 2017-04-22 DIAGNOSIS — Z96641 Presence of right artificial hip joint: Secondary | ICD-10-CM | POA: Diagnosis not present

## 2017-04-22 DIAGNOSIS — R262 Difficulty in walking, not elsewhere classified: Secondary | ICD-10-CM | POA: Diagnosis not present

## 2017-04-23 DIAGNOSIS — Z471 Aftercare following joint replacement surgery: Secondary | ICD-10-CM | POA: Diagnosis not present

## 2017-04-23 DIAGNOSIS — Z96641 Presence of right artificial hip joint: Secondary | ICD-10-CM | POA: Diagnosis not present

## 2017-04-23 DIAGNOSIS — M25551 Pain in right hip: Secondary | ICD-10-CM | POA: Diagnosis not present

## 2017-04-25 DIAGNOSIS — Z96641 Presence of right artificial hip joint: Secondary | ICD-10-CM | POA: Diagnosis not present

## 2017-04-25 DIAGNOSIS — M25551 Pain in right hip: Secondary | ICD-10-CM | POA: Diagnosis not present

## 2017-04-25 DIAGNOSIS — M25651 Stiffness of right hip, not elsewhere classified: Secondary | ICD-10-CM | POA: Diagnosis not present

## 2017-04-25 DIAGNOSIS — R262 Difficulty in walking, not elsewhere classified: Secondary | ICD-10-CM | POA: Diagnosis not present

## 2017-04-30 DIAGNOSIS — Z96641 Presence of right artificial hip joint: Secondary | ICD-10-CM | POA: Diagnosis not present

## 2017-04-30 DIAGNOSIS — R262 Difficulty in walking, not elsewhere classified: Secondary | ICD-10-CM | POA: Diagnosis not present

## 2017-04-30 DIAGNOSIS — M25651 Stiffness of right hip, not elsewhere classified: Secondary | ICD-10-CM | POA: Diagnosis not present

## 2017-04-30 DIAGNOSIS — M25551 Pain in right hip: Secondary | ICD-10-CM | POA: Diagnosis not present

## 2017-05-02 DIAGNOSIS — M25551 Pain in right hip: Secondary | ICD-10-CM | POA: Diagnosis not present

## 2017-05-02 DIAGNOSIS — R262 Difficulty in walking, not elsewhere classified: Secondary | ICD-10-CM | POA: Diagnosis not present

## 2017-05-02 DIAGNOSIS — Z96641 Presence of right artificial hip joint: Secondary | ICD-10-CM | POA: Diagnosis not present

## 2017-05-02 DIAGNOSIS — M25651 Stiffness of right hip, not elsewhere classified: Secondary | ICD-10-CM | POA: Diagnosis not present

## 2017-05-07 DIAGNOSIS — M25651 Stiffness of right hip, not elsewhere classified: Secondary | ICD-10-CM | POA: Diagnosis not present

## 2017-05-07 DIAGNOSIS — M25551 Pain in right hip: Secondary | ICD-10-CM | POA: Diagnosis not present

## 2017-05-07 DIAGNOSIS — Z96641 Presence of right artificial hip joint: Secondary | ICD-10-CM | POA: Diagnosis not present

## 2017-05-07 DIAGNOSIS — R262 Difficulty in walking, not elsewhere classified: Secondary | ICD-10-CM | POA: Diagnosis not present

## 2017-06-04 DIAGNOSIS — Z96641 Presence of right artificial hip joint: Secondary | ICD-10-CM | POA: Diagnosis not present

## 2017-06-10 ENCOUNTER — Encounter: Payer: Self-pay | Admitting: Cardiology

## 2017-06-10 ENCOUNTER — Ambulatory Visit (INDEPENDENT_AMBULATORY_CARE_PROVIDER_SITE_OTHER): Payer: Medicare Other | Admitting: Cardiology

## 2017-06-10 VITALS — BP 136/82 | HR 68 | Ht 74.0 in | Wt 203.0 lb

## 2017-06-10 DIAGNOSIS — I481 Persistent atrial fibrillation: Secondary | ICD-10-CM | POA: Diagnosis not present

## 2017-06-10 DIAGNOSIS — I10 Essential (primary) hypertension: Secondary | ICD-10-CM

## 2017-06-10 DIAGNOSIS — I4819 Other persistent atrial fibrillation: Secondary | ICD-10-CM

## 2017-06-10 NOTE — Patient Instructions (Signed)
Medication Instructions:  Your physician recommends that you continue on your current medications as directed. Please refer to the Current Medication list given to you today.  * If you need a refill on your cardiac medications before your next appointment, please call your pharmacy.   Labwork: None ordered  Testing/Procedures: None ordered  Follow-Up: Your physician wants you to follow-up in: 1 year with Dr. Camnitz.  You will receive a reminder letter in the mail two months in advance. If you don't receive a letter, please call our office to schedule the follow-up appointment.  Thank you for choosing [facility]!!   Adna Nofziger, RN [phone]        

## 2017-06-10 NOTE — Progress Notes (Signed)
Electrophysiology Office Note   Date:  06/10/2017   ID:  Bradley Roberson, DOB Feb 08, 1941, MRN [id number]  PCP:  Gaynelle Arabian, MD  Primary Electrophysiologist:  Constance Haw, MD    No chief complaint on file.    History of Present Illness: Bradley Roberson is a 77 y.o. male who presents today for electrophysiology evaluation.   He has a history of atrial fibrillation and hypertension. He was recently diagnosed with atrial fibrillation. He was started on rivaroxaban by his primary care physician. He says that he was getting a colonoscopy in March when he was noted to be in atrial fibrillation.  Cardioversion was attempted but not successful.  Today, denies symptoms of palpitations, chest pain, shortness of breath, orthopnea, PND, lower extremity edema, claudication, dizziness, presyncope, syncope, bleeding, or neurologic sequela. The patient is tolerating medications without difficulties.  He continues to be in atrial fibrillation without symptoms of weakness, fatigue, or shortness of breath.  He recently had hip replacement surgery and has been recovering from that.  No major complaints at this time.  Past Medical History:  Diagnosis Date  . Atrial fibrillation ([place]) 08/2015  . 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 Erectile dysfunction   . Hypertension    Past Surgical History:  Procedure Laterality Date  . CARDIOVERSION N/A 11/10/2015   Procedure: CARDIOVERSION;  Roberson: Larey Dresser, MD;  Location: [facility];  Service: Cardiovascular;  Laterality: N/A;  . CATARACT EXTRACTION W/ INTRAOCULAR LENS IMPLANT  2018  . COLONOSCOPY  2006  . KNEE SURGERY     x 5  . right anterior cruciate ligament tear    . TOTAL HIP ARTHROPLASTY Right 03/11/2017   Procedure: RIGHT TOTAL HIP ARTHROPLASTY ANTERIOR APPROACH;  Roberson: [name], MD;  Location: [facility];  Service: Orthopedics;  Laterality: Right;  Marland Kitchen VASECTOMY       Current Outpatient Medications    Medication Sig Dispense Refill  . amLODipine (NORVASC) 5 MG tablet Take 5 mg daily by mouth.    . benazepril (LOTENSIN) 40 MG tablet Take 1 tablet (40 mg total) by mouth daily. 90 tablet 3  . carvedilol (COREG) 6.25 MG tablet Take 1 tablet (6.25 mg total) by mouth 2 (two) times daily. (Patient not taking: Reported on 02/27/2017) 180 tablet 3  . oxyCODONE-acetaminophen (ROXICET) 5-325 MG tablet Take 1 tablet by mouth every 4 (four) hours as needed. 30 tablet 0  . rivaroxaban (XARELTO) 10 MG TABS tablet Take 1 tablet (10 mg total) by mouth daily. 14 tablet 0  . sildenafil (REVATIO) 20 MG tablet Take 20 mg by mouth daily as needed (sexual activity).     Marland Kitchen tiZANidine (ZANAFLEX) 2 MG tablet Take 1 tablet (2 mg total) by mouth every 6 (six) hours as needed for muscle spasms. 60 tablet 0   No current facility-administered medications for this visit.     Allergies:   Patient has no known allergies.   Social History:  The patient  reports that  has never smoked. he has never used smokeless tobacco. He reports that he drinks about 1.8 oz of alcohol per week. He reports that he does not use drugs.   Family History:  The patient's family history includes Cirrhosis in his father; Hemochromatosis in his father; Hypertension in his mother; Liver cancer in his father.   ROS:  Please see the history of present illness.   Otherwise, review of systems is positive for none.   All  other systems are reviewed and negative.   PHYSICAL EXAM: VS:  There were no vitals taken for this visit. , BMI There is no height or weight on file to calculate BMI. GEN: Well nourished, well developed, in no acute distress  HEENT: normal  Neck: no JVD, carotid bruits, or masses Cardiac: iRRR; no murmurs, rubs, or gallops,no edema  Respiratory:  clear to auscultation bilaterally, normal work of breathing GI: soft, nontender, nondistended, + BS MS: no deformity or atrophy  Skin: warm and dry Neuro:  Strength and sensation are  intact Psych: euthymic mood, full affect  EKG:  EKG is not ordered today. Personal review of the ekg ordered 02/28/17 shows atrial fibrillation, rate 75    Recent Labs: 02/28/2017: BUN 13; Creatinine, Ser 0.77; Potassium 4.2; Sodium 139 03/13/2017: Hemoglobin 11.5; Platelets 195    Lipid Panel  No results found for: CHOL, TRIG, HDL, CHOLHDL, VLDL, LDLCALC, LDLDIRECT   Wt Readings from Last 3 Encounters:  03/11/17 204 lb (92.5 kg)  02/28/17 204 lb 9.6 oz (92.8 kg)  12/10/16 204 lb 9.6 oz (92.8 kg)      Other studies Reviewed: Additional studies/ records that were reviewed today include: TTE 09/28/15 - Left ventricle: The cavity size was severely dilated. There was  mild focal basal hypertrophy of the septum. Systolic function was  mildly reduced. The estimated ejection fraction was in the range  of 45% to 50%. Diffuse hypokinesis. - Aortic valve: Trileaflet; moderately thickened, moderately  calcified leaflets. There was mild regurgitation. - Mitral valve: Calcified annulus. There was trivial regurgitation. - Left atrium: The appendage was severely dilated. - Right ventricle: The cavity size was mildly dilated. Wall  thickness was normal. Systolic function was normal. - Right atrium: The appendage was severely dilated. - Atrial septum: No defect or patent foramen ovale was identified  by color flow Doppler. - Tricuspid valve: There was mild regurgitation. - Pulmonary arteries: Systolic pressure was within the normal  range. PA peak pressure: 34 mm Hg (S). - Inferior vena cava: The vessel was dilated. The respirophasic  diameter changes were in the normal range (>= 50%), consistent  with elevated central venous pressure.  Impressions:  - Systolic function may be underestimated due to atrial  fibrillation.   ASSESSMENT AND PLAN:  1.  Long-standing persistent atrial fibrillation: Currently in sinus rhythm.  He is feeling well without major complaint.  His  heart rate is well controlled not on rate controlling medications.  He is on Xarelto for anticoagulation.  As he is feeling well, he does not have any desire to return to normal rhythm.  This patients CHA2DS2-VASc Score and unadjusted Ischemic Stroke Rate (% per year) is equal to 3.2 % stroke rate/year from a score of 3  Above score calculated as 1 point each if present [CHF, HTN, DM, Vascular=MI/PAD/Aortic Plaque, Age if 65-74, or Male] Above score calculated as 2 points each if present [Age > 75, or Stroke/TIA/TE]    2. Hypertension: Blood pressure well controlled today.  No changes.  Current medicines are reviewed at length with the patient today.   The patient does not have concerns regarding his medicines.  The following changes were made today: none  Labs/ tests ordered today include:  No orders of the defined types were placed in this encounter.    Disposition:   FU with Zury Fazzino 12 months   Signed, Kaj Vasil Meredith Leeds, MD  06/10/2017 8:48 AM     [facility]  [address] [address] [phone] (office) [phone] (fax)

## 2017-09-26 DIAGNOSIS — Z1389 Encounter for screening for other disorder: Secondary | ICD-10-CM | POA: Diagnosis not present

## 2017-09-26 DIAGNOSIS — N529 Male erectile dysfunction, unspecified: Secondary | ICD-10-CM | POA: Diagnosis not present

## 2017-09-26 DIAGNOSIS — Z13220 Encounter for screening for lipoid disorders: Secondary | ICD-10-CM | POA: Diagnosis not present

## 2017-09-26 DIAGNOSIS — D692 Other nonthrombocytopenic purpura: Secondary | ICD-10-CM | POA: Diagnosis not present

## 2017-09-26 DIAGNOSIS — I4891 Unspecified atrial fibrillation: Secondary | ICD-10-CM | POA: Diagnosis not present

## 2017-09-26 DIAGNOSIS — I10 Essential (primary) hypertension: Secondary | ICD-10-CM | POA: Diagnosis not present

## 2017-09-26 DIAGNOSIS — M169 Osteoarthritis of hip, unspecified: Secondary | ICD-10-CM | POA: Diagnosis not present

## 2017-12-26 DIAGNOSIS — L814 Other melanin hyperpigmentation: Secondary | ICD-10-CM | POA: Diagnosis not present

## 2017-12-26 DIAGNOSIS — Z85828 Personal history of other malignant neoplasm of skin: Secondary | ICD-10-CM | POA: Diagnosis not present

## 2017-12-26 DIAGNOSIS — L570 Actinic keratosis: Secondary | ICD-10-CM | POA: Diagnosis not present

## 2017-12-26 DIAGNOSIS — L821 Other seborrheic keratosis: Secondary | ICD-10-CM | POA: Diagnosis not present

## 2017-12-26 DIAGNOSIS — C44519 Basal cell carcinoma of skin of other part of trunk: Secondary | ICD-10-CM | POA: Diagnosis not present

## 2017-12-26 DIAGNOSIS — D485 Neoplasm of uncertain behavior of skin: Secondary | ICD-10-CM | POA: Diagnosis not present

## 2017-12-26 DIAGNOSIS — D1800 Hemangioma unspecified site: Secondary | ICD-10-CM | POA: Diagnosis not present

## 2017-12-26 DIAGNOSIS — D225 Melanocytic nevi of trunk: Secondary | ICD-10-CM | POA: Diagnosis not present

## 2018-01-13 DIAGNOSIS — C44519 Basal cell carcinoma of skin of other part of trunk: Secondary | ICD-10-CM | POA: Diagnosis not present

## 2018-01-13 DIAGNOSIS — Z23 Encounter for immunization: Secondary | ICD-10-CM | POA: Diagnosis not present

## 2018-03-21 DIAGNOSIS — Z961 Presence of intraocular lens: Secondary | ICD-10-CM | POA: Diagnosis not present

## 2018-06-10 ENCOUNTER — Encounter: Payer: Self-pay | Admitting: Cardiology

## 2018-06-10 ENCOUNTER — Other Ambulatory Visit: Payer: Self-pay | Admitting: *Deleted

## 2018-06-10 ENCOUNTER — Ambulatory Visit (INDEPENDENT_AMBULATORY_CARE_PROVIDER_SITE_OTHER): Payer: Medicare Other | Admitting: Cardiology

## 2018-06-10 VITALS — BP 120/70 | HR 66 | Ht 74.0 in | Wt 212.0 lb

## 2018-06-10 DIAGNOSIS — I4821 Permanent atrial fibrillation: Secondary | ICD-10-CM | POA: Diagnosis not present

## 2018-06-10 NOTE — Progress Notes (Signed)
Electrophysiology Office Note   Date:  06/10/2018   ID:  Bradley, Roberson Sep 05, 1940, MRN [id number]  PCP:  Gaynelle Arabian, MD  Primary Electrophysiologist:  Constance Haw, MD    No chief complaint on file.    History of Present Illness: Bradley Roberson is a 78 y.o. male who presents today for electrophysiology evaluation.   He has a history of atrial fibrillation and hypertension. He was recently diagnosed with atrial fibrillation. He was started on rivaroxaban by his primary care physician. He says that he was getting a colonoscopy in March when he was noted to be in atrial fibrillation.  Cardioversion was attempted but not successful.  Today, denies symptoms of palpitations, chest pain, shortness of breath, orthopnea, PND, lower extremity edema, claudication, dizziness, presyncope, syncope, bleeding, or neurologic sequela. The patient is tolerating medications without difficulties.  Overall he is feeling well.  He has no chest pain or shortness of breath.  Is able to do all of his daily activities without restriction.  He is unaware of his atrial fibrillation.  Past Medical History:  Diagnosis Date  . Atrial fibrillation ([place]) 08/2015  . 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 Erectile dysfunction   . Hypertension    Past Surgical History:  Procedure Laterality Date  . CARDIOVERSION N/A 11/10/2015   Procedure: CARDIOVERSION;  Surgeon: Larey Dresser, MD;  Location: [facility];  Service: Cardiovascular;  Laterality: N/A;  . CATARACT EXTRACTION W/ INTRAOCULAR LENS IMPLANT  2018  . COLONOSCOPY  2006  . KNEE SURGERY     x 5  . right anterior cruciate ligament tear    . TOTAL HIP ARTHROPLASTY Right 03/11/2017   Procedure: RIGHT TOTAL HIP ARTHROPLASTY ANTERIOR APPROACH;  Surgeon: [name], MD;  Location: [facility];  Service: Orthopedics;  Laterality: Right;  Marland Kitchen VASECTOMY       Current Outpatient Medications  Medication Sig Dispense Refill  .  amLODipine (NORVASC) 5 MG tablet Take 5 mg daily by mouth.    . benazepril (LOTENSIN) 40 MG tablet Take 1 tablet (40 mg total) by mouth daily. 90 tablet 3  . rivaroxaban (XARELTO) 10 MG TABS tablet Take 1 tablet (10 mg total) by mouth daily. 14 tablet 0  . sildenafil (REVATIO) 20 MG tablet Take 20 mg by mouth daily as needed (sexual activity).      No current facility-administered medications for this visit.     Allergies:   Patient has no known allergies.   Social History:  The patient  reports that he has never smoked. He has never used smokeless tobacco. He reports current alcohol use of about 3.0 standard drinks of alcohol per week. He reports that he does not use drugs.   Family History:  The patient's family history includes Cirrhosis in his father; Hemochromatosis in his father; Hypertension in his mother; Liver cancer in his father.   ROS:  Please see the history of present illness.   Otherwise, review of systems is positive for none.   All other systems are reviewed and negative.   PHYSICAL EXAM: VS:  BP 120/70   Pulse 66   Ht 6\' 2"  (1.88 m)   Wt 212 lb (96.2 kg)   BMI 27.22 kg/m  , BMI Body mass index is 27.22 kg/m. GEN: Well nourished, well developed, in no acute distress  HEENT: normal  Neck: no JVD, carotid bruits, or masses Cardiac: iRRR; no murmurs, rubs, or gallops,no edema  Respiratory:  clear to auscultation bilaterally, normal work of breathing GI: soft, nontender, nondistended, + BS MS: no deformity or atrophy  Skin: warm and dry Neuro:  Strength and sensation are intact Psych: euthymic mood, full affect  EKG:  EKG is ordered today. Personal review of the ekg ordered shows atrial fibrillation, rate 66   Recent Labs: No results found for requested labs within last 8760 hours.    Lipid Panel  No results found for: CHOL, TRIG, HDL, CHOLHDL, VLDL, LDLCALC, LDLDIRECT   Wt Readings from Last 3 Encounters:  06/10/18 212 lb (96.2 kg)  06/10/17 203 lb (92.1  kg)  03/11/17 204 lb (92.5 kg)      Other studies Reviewed: Additional studies/ records that were reviewed today include: TTE 09/28/15 - Left ventricle: The cavity size was severely dilated. There was  mild focal basal hypertrophy of the septum. Systolic function was  mildly reduced. The estimated ejection fraction was in the range  of 45% to 50%. Diffuse hypokinesis. - Aortic valve: Trileaflet; moderately thickened, moderately  calcified leaflets. There was mild regurgitation. - Mitral valve: Calcified annulus. There was trivial regurgitation. - Left atrium: The appendage was severely dilated. - Right ventricle: The cavity size was mildly dilated. Wall  thickness was normal. Systolic function was normal. - Right atrium: The appendage was severely dilated. - Atrial septum: No defect or patent foramen ovale was identified  by color flow Doppler. - Tricuspid valve: There was mild regurgitation. - Pulmonary arteries: Systolic pressure was within the normal  range. PA peak pressure: 34 mm Hg (S). - Inferior vena cava: The vessel was dilated. The respirophasic  diameter changes were in the normal range (>= 50%), consistent  with elevated central venous pressure.  Impressions:  - Systolic function may be underestimated due to atrial  fibrillation.   ASSESSMENT AND PLAN:  1.  Permanent atrial fibrillation: On Xarelto.  Currently feeling well.  Has no complaints due to atrial fibrillation.  No changes at this time.  This patients CHA2DS2-VASc Score and unadjusted Ischemic Stroke Rate (% per year) is equal to 3.2 % stroke rate/year from a score of 3  Above score calculated as 1 point each if present [CHF, HTN, DM, Vascular=MI/PAD/Aortic Plaque, Age if 65-74, or Male] Above score calculated as 2 points each if present [Age > 75, or Stroke/TIA/TE]    2. Hypertension: Currently well controlled.  No changes.  Current medicines are reviewed at length with the patient  today.   The patient does not have concerns regarding his medicines.  The following changes were made today: None  Labs/ tests ordered today include:  Orders Placed This Encounter  Procedures  . EKG 12-Lead     Disposition:   FU with Adelyna Brockman 12 months   Signed, Shemicka Cohrs Meredith Leeds, MD  06/10/2018 9:57 AM     [facility] [address] [place] [address] [phone] (office) [phone] (fax)

## 2018-06-10 NOTE — Patient Instructions (Signed)
Medication Instructions:  Your physician recommends that you continue on your current medications as directed. Please refer to the Current Medication list given to you today.  * If you need a refill on your cardiac medications before your next appointment, please call your pharmacy.   Labwork: None ordered *We will only notify you of abnormal results, otherwise continue current treatment plan.  Testing/Procedures: None ordered  Follow-Up: Your physician wants you to follow-up in: 1 year with Dr. Curt Bears.  You will receive a reminder letter in the mail two months in advance. If you don't receive a letter, please call our office to schedule the follow-up appointment.  *Please note that any paperwork needing to be filled out by the provider will need to be addressed at the front desk prior to seeing the provider. Please note that any FMLA, disability or other documents regarding health condition is subject to a $25.00 charge that must be received prior to completion of paperwork in the form of a money order or check.  Thank you for choosing [facility]!!   Trinidad Curet, RN [phone]  Any Other Special Instructions Will Be Listed Below (If Applicable).  Have your primary doctor draw BMP & CBC at your next office visit.

## 2018-09-30 DIAGNOSIS — Z1389 Encounter for screening for other disorder: Secondary | ICD-10-CM | POA: Diagnosis not present

## 2018-09-30 DIAGNOSIS — M169 Osteoarthritis of hip, unspecified: Secondary | ICD-10-CM | POA: Diagnosis not present

## 2018-09-30 DIAGNOSIS — I10 Essential (primary) hypertension: Secondary | ICD-10-CM | POA: Diagnosis not present

## 2018-09-30 DIAGNOSIS — I4891 Unspecified atrial fibrillation: Secondary | ICD-10-CM | POA: Diagnosis not present

## 2018-09-30 DIAGNOSIS — N529 Male erectile dysfunction, unspecified: Secondary | ICD-10-CM | POA: Diagnosis not present

## 2018-10-07 DIAGNOSIS — I10 Essential (primary) hypertension: Secondary | ICD-10-CM | POA: Diagnosis not present

## 2018-10-07 DIAGNOSIS — I4891 Unspecified atrial fibrillation: Secondary | ICD-10-CM | POA: Diagnosis not present

## 2018-12-29 IMAGING — RF DG HIP (WITH PELVIS) OPERATIVE*R*
1 series · 2 of 2 positions shown · non-contrast
Comparison: None.

CLINICAL DATA: Right anterior hip replacement

EXAM:
OPERATIVE RIGHT HIP (WITH PELVIS IF PERFORMED) 2 VIEWS
TECHNIQUE: Fluoroscopic spot image(s) were submitted for interpretation
post-operatively.

[Series 1: run · 2 of 2 slices shown]
[im 1/2]
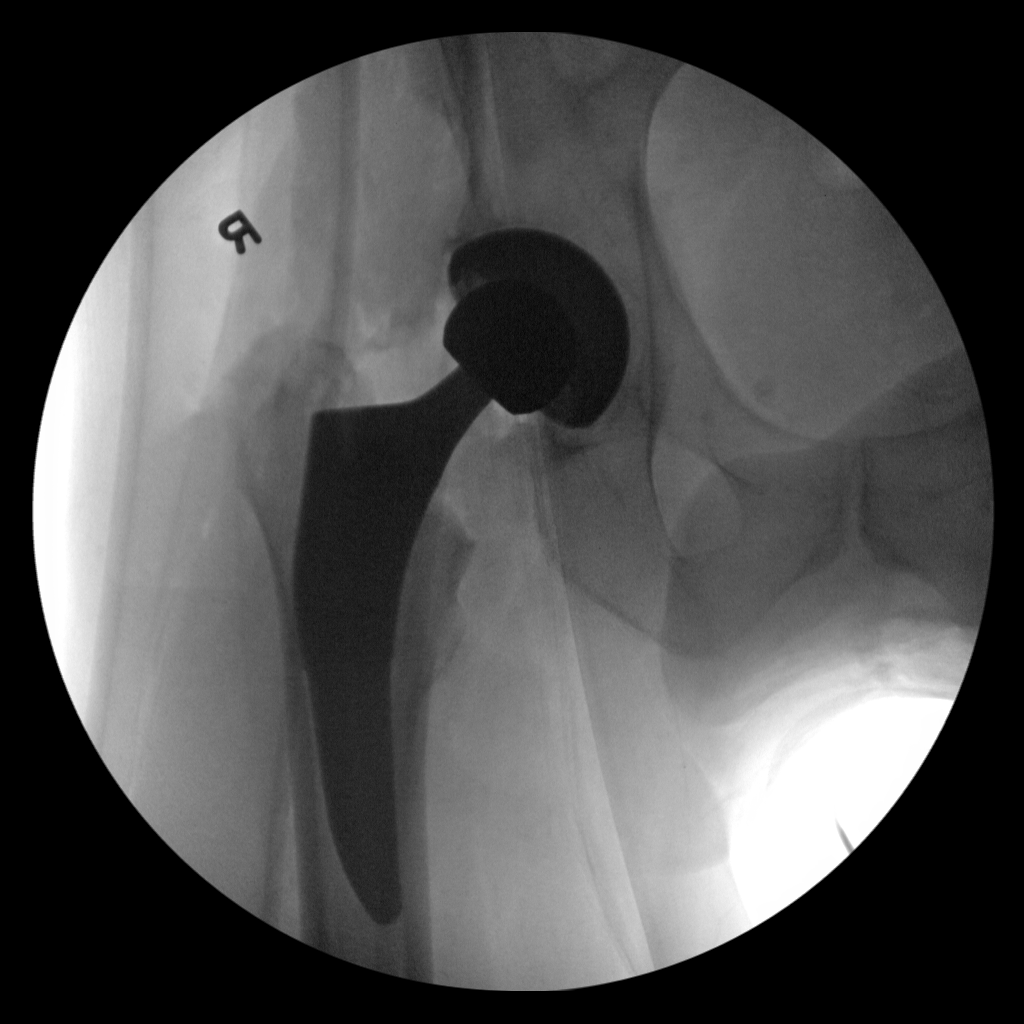
[im 2/2]
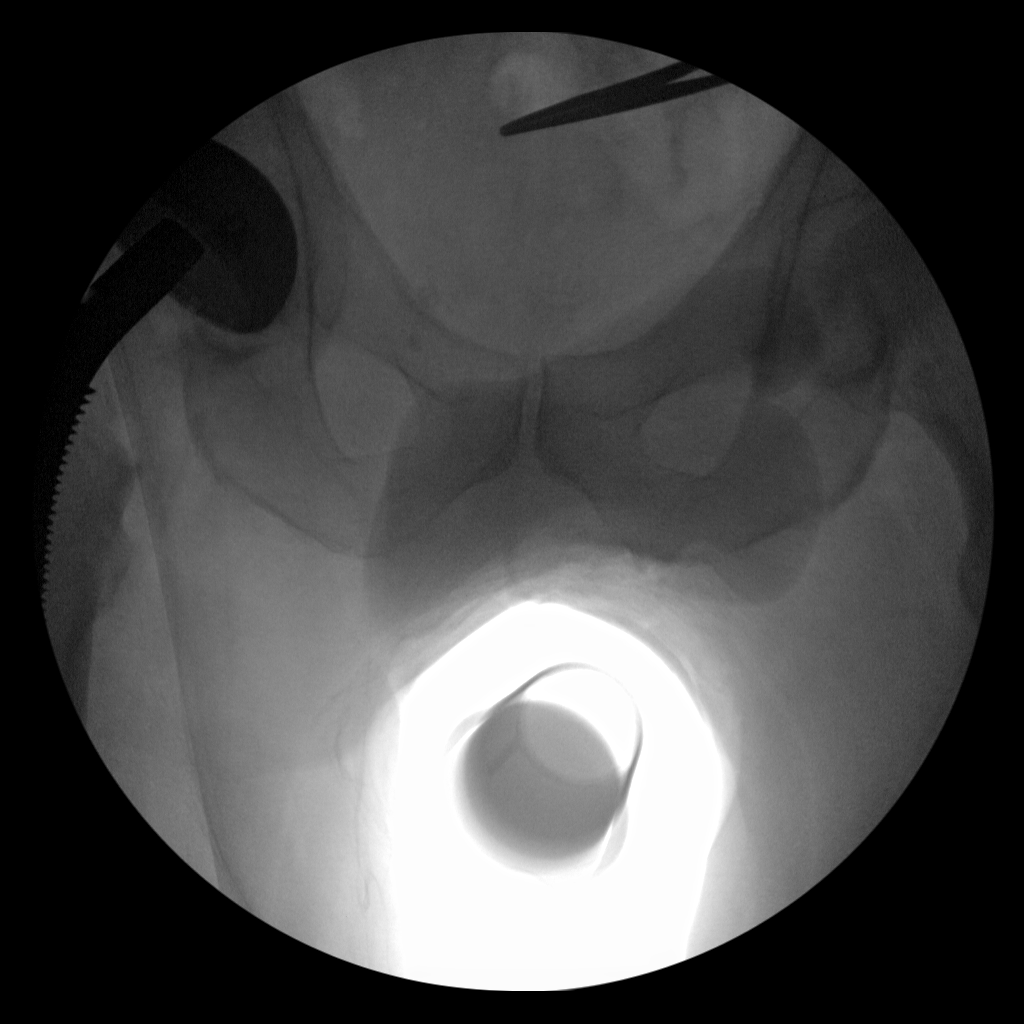

[2 of 2 positions shown; findings below may reference images not displayed]

FINDINGS: Changes of right hip replacement. No hardware or bony complicating
feature. Normal AP alignment.
IMPRESSION: Right hip replacement.  No visible complicating feature.

## 2018-12-31 DIAGNOSIS — Z23 Encounter for immunization: Secondary | ICD-10-CM | POA: Diagnosis not present

## 2018-12-31 DIAGNOSIS — L821 Other seborrheic keratosis: Secondary | ICD-10-CM | POA: Diagnosis not present

## 2018-12-31 DIAGNOSIS — L570 Actinic keratosis: Secondary | ICD-10-CM | POA: Diagnosis not present

## 2018-12-31 DIAGNOSIS — D225 Melanocytic nevi of trunk: Secondary | ICD-10-CM | POA: Diagnosis not present

## 2018-12-31 DIAGNOSIS — Z85828 Personal history of other malignant neoplasm of skin: Secondary | ICD-10-CM | POA: Diagnosis not present

## 2018-12-31 DIAGNOSIS — L814 Other melanin hyperpigmentation: Secondary | ICD-10-CM | POA: Diagnosis not present

## 2019-01-17 DIAGNOSIS — Z23 Encounter for immunization: Secondary | ICD-10-CM | POA: Diagnosis not present

## 2019-03-25 DIAGNOSIS — Z961 Presence of intraocular lens: Secondary | ICD-10-CM | POA: Diagnosis not present

## 2019-03-25 DIAGNOSIS — H26493 Other secondary cataract, bilateral: Secondary | ICD-10-CM | POA: Diagnosis not present

## 2019-06-06 ENCOUNTER — Ambulatory Visit: Payer: Medicare Other | Attending: Internal Medicine

## 2019-06-06 DIAGNOSIS — Z23 Encounter for immunization: Secondary | ICD-10-CM | POA: Insufficient documentation

## 2019-06-06 NOTE — Progress Notes (Signed)
   Covid-19 Vaccination Clinic  Name:  Bradley Roberson    MRN: [id number] DOB: June 08, 1940  06/06/2019  [name] was observed post Covid-19 immunization for 15 minutes without incidence. He was provided with Vaccine Information Sheet and instruction to access the V-Safe system.   [name] was instructed to call 911 with any severe reactions post vaccine: Marland Kitchen Difficulty breathing  . Swelling of your face and throat  . A fast heartbeat  . A bad rash all over your body  . Dizziness and weakness    Immunizations Administered    Name Date Dose VIS Date Route   Pfizer COVID-19 Vaccine 06/06/2019  8:32 AM 0.3 mL 03/27/2019 Intramuscular   Manufacturer: [place]   Lot: [id number]   [place]: [id number]

## 2019-06-06 NOTE — Progress Notes (Signed)
   Covid-19 Vaccination Clinic  Name:  DANYE POGANY    MRN: [id number] DOB: [birth year]  06/06/2019  Mr. Hipple was observed post Covid-19 immunization for 15 minutes without incidence. He was provided with Vaccine Information Sheet and instruction to access the V-Safe system.   Mr. Vandenbosch was instructed to call 911 with any severe reactions post vaccine: Marland Kitchen Difficulty breathing  . Swelling of your face and throat  . A fast heartbeat  . A bad rash all over your body  . Dizziness and weakness    Immunizations Administered    Name Date Dose VIS Date Route   Pfizer COVID-19 Vaccine 06/06/2019  8:32 AM 0.3 mL 03/27/2019 Intramuscular   Manufacturer: [place]   Lot: [id number]   [place]: [id number]

## 2019-06-18 ENCOUNTER — Encounter: Payer: Self-pay | Admitting: Cardiology

## 2019-06-18 ENCOUNTER — Ambulatory Visit (INDEPENDENT_AMBULATORY_CARE_PROVIDER_SITE_OTHER): Payer: Medicare Other | Admitting: Cardiology

## 2019-06-18 ENCOUNTER — Other Ambulatory Visit: Payer: Self-pay

## 2019-06-18 VITALS — BP 120/72 | HR 92 | Ht 74.0 in | Wt 209.6 lb

## 2019-06-18 DIAGNOSIS — I4821 Permanent atrial fibrillation: Secondary | ICD-10-CM

## 2019-06-18 NOTE — Progress Notes (Signed)
Electrophysiology Office Note   Date:  06/18/2019   ID:  Bradley Roberson, DOB [birth year], MRN [id number]  PCP:  Gaynelle Arabian, MD  Primary Electrophysiologist:  Constance Haw, MD    No chief complaint on file.    History of Present Illness: Bradley Roberson is a 79 y.o. male who presents today for electrophysiology evaluation.   He has a history of atrial fibrillation and hypertension. He was recently diagnosed with atrial fibrillation. He was started on rivaroxaban by his primary care physician. He says that he was getting a colonoscopy in March when he was noted to be in atrial fibrillation.  Cardioversion was attempted but not successful.  Today, denies symptoms of palpitations, chest pain, shortness of breath, orthopnea, PND, lower extremity edema, claudication, dizziness, presyncope, syncope, bleeding, or neurologic sequela. The patient is tolerating medications without difficulties.  Overall he is doing well.  He has no chest pain or shortness of breath.  He is able to do all of his daily these.  He continues to exercise.  He does say that he Hoy Fallert need likely a knee replacement upcoming.  I have told him to have his orthopedic surgeon get in touch with our office to discuss anticoagulation strategies.  Past Medical History:  Diagnosis Date  . Atrial fibrillation ([place]) 08/2015  . 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 Erectile dysfunction   . Hypertension    Past Surgical History:  Procedure Laterality Date  . CARDIOVERSION N/A 11/10/2015   Procedure: CARDIOVERSION;  Surgeon: Larey Dresser, MD;  Location: [facility];  Service: Cardiovascular;  Laterality: N/A;  . CATARACT EXTRACTION W/ INTRAOCULAR LENS IMPLANT  2018  . COLONOSCOPY  2006  . KNEE SURGERY     x 5  . right anterior cruciate ligament tear    . 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 amLODipine (NORVASC) 5 MG tablet Take 5 mg daily by mouth.    . benazepril (LOTENSIN) 40 MG tablet Take 1 tablet (40 mg total) by mouth daily. 90 tablet 3  . rivaroxaban (XARELTO) 20 MG TABS tablet Take 1 tablet (20 mg total) by mouth daily.    . sildenafil (REVATIO) 20 MG tablet Take 20 mg by mouth daily as needed (sexual activity).      No current facility-administered medications for this visit.    Allergies:   Patient has no known allergies.   Social History:  The patient  reports that he has never smoked. He has never used smokeless tobacco. He reports current alcohol use of about 3.0 standard drinks of alcohol per week. He reports that he does not use drugs.   Family History:  The patient's family history includes Cirrhosis in his father; Hemochromatosis in his father; Hypertension in his mother; Liver cancer in his father.   ROS:  Please see the history of present illness.   Otherwise, review of systems is positive for none.   All other systems are reviewed and negative.   PHYSICAL EXAM: VS:  BP 120/72   Pulse 92   Ht 6\' 2"  (1.88 m)   Wt 209 lb 9.6 oz (95.1 kg)   SpO2 97%   BMI 26.91 kg/m  , BMI Body mass index  is 26.91 kg/m. GEN: Well nourished, well developed, in no acute distress  HEENT: normal  Neck: no JVD, carotid bruits, or masses Cardiac: irregular; no murmurs, rubs, or gallops,no edema  Respiratory:  clear to auscultation bilaterally, normal work of breathing GI: soft, nontender, nondistended, + BS MS: no deformity or atrophy  Skin: warm and dry Neuro:  Strength and sensation are intact Psych: euthymic mood, full affect  EKG:  EKG is ordered today. Personal review of the ekg ordered shows atrial fibrillation, rate 92  Recent Labs: No results found for requested labs within last 8760 hours.    Lipid Panel  No results found  for: CHOL, TRIG, HDL, CHOLHDL, VLDL, LDLCALC, LDLDIRECT   Wt Readings from Last 3 Encounters:  06/18/19 209 lb 9.6 oz (95.1 kg)  06/10/18 212 lb (96.2 kg)  06/10/17 203 lb (92.1 kg)      Other studies Reviewed: Additional studies/ records that were reviewed today include: TTE 09/28/15 - Left ventricle: The cavity size was severely dilated. There was  mild focal basal hypertrophy of the septum. Systolic function was  mildly reduced. The estimated ejection fraction was in the range  of 45% to 50%. Diffuse hypokinesis. - Aortic valve: Trileaflet; moderately thickened, moderately  calcified leaflets. There was mild regurgitation. - Mitral valve: Calcified annulus. There was trivial regurgitation. - Left atrium: The appendage was severely dilated. - Right ventricle: The cavity size was mildly dilated. Wall  thickness was normal. Systolic function was normal. - Right atrium: The appendage was severely dilated. - Atrial septum: No defect or patent foramen ovale was identified  by color flow Doppler. - Tricuspid valve: There was mild regurgitation. - Pulmonary arteries: Systolic pressure was within the normal  range. PA peak pressure: 34 mm Hg (S). - Inferior vena cava: The vessel was dilated. The respirophasic  diameter changes were in the normal range (>= 50%), consistent  with elevated central venous pressure.  Impressions:  - Systolic function may be underestimated due to atrial  fibrillation.   ASSESSMENT AND PLAN:  1.  Permanent atrial fibrillation: Currently on Xarelto.  CHA2DS2-VASc of 3.  Minimally symptomatic from his atrial fibrillation.  He is well rate controlled.  No changes at this time.    2. Hypertension: Currently well controlled  Current medicines are reviewed at length with the patient today.   The patient does not have concerns regarding his medicines.  The following changes were made today: None  Labs/ tests ordered today include:  Orders  Placed This Encounter  Procedures  . EKG 12-Lead     Disposition:   FU with Seleena Reimers 12 months   Signed, Sajad Glander Meredith Leeds, MD  06/18/2019 9:54 AM     [facility] [address] [place] [address] [phone] (office) [phone] (fax)

## 2019-06-30 ENCOUNTER — Ambulatory Visit: Payer: Medicare Other | Attending: Internal Medicine

## 2019-06-30 DIAGNOSIS — Z23 Encounter for immunization: Secondary | ICD-10-CM

## 2019-06-30 NOTE — Progress Notes (Signed)
   Covid-19 Vaccination Clinic  Name:  Bradley Roberson    MRN: [id number] DOB: [birth year]  06/30/2019  [name] was observed post Covid-19 immunization for 15 minutes without incident. He was provided with Vaccine Information Sheet and instruction to access the V-Safe system.   [name] was instructed to call 911 with any severe reactions post vaccine: Marland Kitchen Difficulty breathing  . Swelling of face and throat  . A fast heartbeat  . A bad rash all over body  . Dizziness and weakness   Immunizations Administered    Name Date Dose VIS Date Route   Pfizer COVID-19 Vaccine 06/30/2019  8:14 AM 0.3 mL 03/27/2019 Intramuscular   Manufacturer: [place]   Lot: [id number]   [place]: [id number]

## 2019-07-07 DIAGNOSIS — M1712 Unilateral primary osteoarthritis, left knee: Secondary | ICD-10-CM | POA: Diagnosis not present

## 2019-07-24 ENCOUNTER — Other Ambulatory Visit: Payer: Self-pay | Admitting: Orthopedic Surgery

## 2019-07-31 ENCOUNTER — Telehealth: Payer: Self-pay | Admitting: *Deleted

## 2019-07-31 NOTE — Patient Instructions (Addendum)
DUE TO COVID-19 ONLY ONE VISITOR IS ALLOWED TO COME WITH YOU AND STAY IN THE WAITING ROOM ONLY DURING PRE OP AND PROCEDURE DAY OF SURGERY. TWO  VISITOR MAY VISIT WITH YOU AFTER SURGERY IN YOUR PRIVATE ROOM DURING VISITING HOURS ONLY!  10a-8p  YOU NEED TO HAVE A COVID 19 TEST ON_4-29-21______ @_9 :00am ______, THIS TEST MUST BE DONE BEFORE SURGERY, COME  Bradley Roberson, Bradley Roberson , Bradley.  ([place]) ONCE YOUR COVID TEST IS COMPLETED, PLEASE BEGIN THE QUARANTINE INSTRUCTIONS AS OUTLINED IN YOUR HANDOUT.                Bradley Roberson  07/31/2019   Your procedure is scheduled on: 08-17-19   Report to [facility] Main  Entrance   Report to  Short stay  at       0530  AM     Call this number if you have problems the morning of surgery [phone]    Remember: NO SOLID FOOD AFTER MIDNIGHT THE NIGHT PRIOR TO SURGERY. NOTHING BY MOUTH EXCEPT CLEAR LIQUIDS UNTIL   0415 am . PLEASE FINISH ENSURE DRINK PER SURGEON ORDER  WHICH NEEDS TO BE COMPLETED AT      [place] am then nothing by mouth.    CLEAR LIQUID DIET   Foods Allowed                                                                                     Foods Excluded  Coffee and tea, regular and decaf    No creamer                          liquids that you cannot  Plain Jell-O any favor except red or purple                                           see through such as: Fruit ices (not with fruit pulp)                                                            milk, soups, orange juice  Iced Popsicles                                                            All solid food Carbonated beverages, regular and diet                                    Cranberry, grape and apple juices Sports drinks like Gatorade Lightly seasoned clear broth or consume(fat free)  Sugar, honey syrup   _____________________________________________________________________     BRUSH YOUR TEETH MORNING OF SURGERY AND RINSE YOUR  MOUTH OUT, NO CHEWING GUM CANDY OR MINTS.     Take these medicines the morning of surgery with A SIP OF WATER: amlodipine                                 You may not have any metal on your body including hair pins and              piercings  Do not wear jewelry, , lotions, powders or perfumes, deodorant                     Men may shave face and neck.   Do not bring valuables to the hospital. [place].  Contacts, dentures or bridgework may not be worn into surgery.  Leave suitcase in the car. After surgery it may be brought to your room.     Patients discharged the day of surgery will not be allowed to drive home. IF YOU ARE HAVING SURGERY AND GOING HOME THE SAME DAY, YOU MUST HAVE AN ADULT TO DRIVE YOU HOME AND BE WITH YOU FOR 24 HOURS. YOU MAY GO HOME BY TAXI OR UBER OR ORTHERWISE, BUT AN ADULT MUST ACCOMPANY YOU HOME AND STAY WITH YOU FOR 24 HOURS.  Name and phone number of your driver:  Special Instructions: N/A              Please read over the following fact sheets you were given: _____________________________________________________________________             [facility] - Preparing for Surgery Before surgery, you can play an important role.  Because skin is not sterile, your skin needs to be as free of germs as possible.  You can reduce the number of germs on your skin by washing with CHG (chlorahexidine gluconate) soap before surgery.  CHG is an antiseptic cleaner which kills germs and bonds with the skin to continue killing germs even after washing. Please DO NOT use if you have an allergy to CHG or antibacterial soaps.  If your skin becomes reddened/irritated stop using the CHG and inform your nurse when you arrive at Short Stay. Do not shave (including legs and underarms) for at least 48 hours prior to the first CHG shower.  You may shave your face/neck. Please follow these instructions carefully:  1.  Shower with CHG  Soap the night before surgery and the  morning of Surgery.  2.  If you choose to wash your hair, wash your hair first as usual with your  normal  shampoo.  3.  After you shampoo, rinse your hair and body thoroughly to remove the  shampoo.                           4.  Use CHG as you would any other liquid soap.  You can apply chg directly  to the skin and wash                       Gently with a scrungie or clean washcloth.  5.  Apply the CHG Soap to your body ONLY FROM THE NECK DOWN.  Do not use on face/ open                           Wound or open sores. Avoid contact with eyes, ears mouth and genitals (private parts).                       Wash face,  Genitals (private parts) with your normal soap.             6.  Wash thoroughly, paying special attention to the area where your surgery  will be performed.  7.  Thoroughly rinse your body with warm water from the neck down.  8.  DO NOT shower/wash with your normal soap after using and rinsing off  the CHG Soap.                9.  Pat yourself dry with a clean towel.            10.  Wear clean pajamas.            11.  Place clean sheets on your bed the night of your first shower and do not  sleep with pets. Day of Surgery : Do not apply any lotions/deodorants the morning of surgery.  Please wear clean clothes to the hospital/surgery center.  FAILURE TO FOLLOW THESE INSTRUCTIONS MAY RESULT IN THE CANCELLATION OF YOUR SURGERY PATIENT SIGNATURE_________________________________  NURSE SIGNATURE__________________________________  ________________________________________________________________________   Bradley Roberson  An incentive spirometer is a tool that can help keep your lungs clear and active. This tool measures how well you are filling your lungs with each breath. Taking long deep breaths may help reverse or decrease the chance of developing breathing (pulmonary) problems (especially infection) following:  A long period of time  when you are unable to move or be active. BEFORE THE PROCEDURE   If the spirometer includes an indicator to show your best effort, your nurse or respiratory therapist will set it to a desired goal.  If possible, sit up straight or lean slightly forward. Try not to slouch.  Hold the incentive spirometer in an upright position. INSTRUCTIONS FOR USE  1. Sit on the edge of your bed if possible, or sit up as far as you can in bed or on a chair. 2. Hold the incentive spirometer in an upright position. 3. Breathe out normally. 4. Place the mouthpiece in your mouth and seal your lips tightly around it. 5. Breathe in slowly and as deeply as possible, raising the piston or the ball toward the top of the column. 6. Hold your breath for 3-5 seconds or for as long as possible. Allow the piston or ball to fall to the bottom of the column. 7. Remove the mouthpiece from your mouth and breathe out normally. 8. Rest for a few seconds and repeat Steps 1 through 7 at least 10 times every 1-2 hours when you are awake. Take your time and take a few normal breaths between deep breaths. 9. The spirometer may include an indicator to show your best effort. Use the indicator as a goal to work toward during each repetition. 10. After each set of 10 deep breaths, practice coughing to be sure your lungs are clear. If you have an incision (the cut made at the time of surgery), support your incision when coughing by placing a pillow or rolled up towels firmly against it. Once you are able to get out of  bed, walk around indoors and cough well. You may stop using the incentive spirometer when instructed by your caregiver.  RISKS AND COMPLICATIONS  Take your time so you do not get dizzy or light-headed.  If you are in pain, you may need to take or ask for pain medication before doing incentive spirometry. It is harder to take a deep breath if you are having pain. AFTER USE  Rest and breathe slowly and easily.  It can be  helpful to keep track of a log of your progress. Your caregiver can provide you with a simple table to help with this. If you are using the spirometer at home, follow these instructions: [place] IF:   You are having difficultly using the spirometer.  You have trouble using the spirometer as often as instructed.  Your pain medication is not giving enough relief while using the spirometer.  You develop fever of 100.5 F (38.1 C) or higher. SEEK IMMEDIATE MEDICAL CARE IF:   You cough up bloody sputum that had not been present before.  You develop fever of 102 F (38.9 C) or greater.  You develop worsening pain at or near the incision site. MAKE SURE YOU:   Understand these instructions.  Will watch your condition.  Will get help right away if you are not doing well or get worse. Document Released: 08/13/2006 Document Revised: 06/25/2011 Document Reviewed: 10/14/2006 ExitCare Patient Information 2014 ExitCare, [place].   ________________________________________________________________________  WHAT IS A BLOOD TRANSFUSION? Blood Transfusion Information  A transfusion is the replacement of blood or some of its parts. Blood is made up of multiple cells which provide different functions.  Red blood cells carry oxygen and are used for blood loss replacement.  White blood cells fight against infection.  Platelets control bleeding.  Plasma helps clot blood.  Other blood products are available for specialized needs, such as hemophilia or other clotting disorders. BEFORE THE TRANSFUSION  Who gives blood for transfusions?   Healthy volunteers who are fully evaluated to make sure their blood is safe. This is blood bank blood. Transfusion therapy is the safest it has ever been in the practice of medicine. Before blood is taken from a donor, a complete history is taken to make sure that person has no history of diseases nor engages in risky social behavior (examples are  intravenous drug use or sexual activity with multiple partners). The donor's travel history is screened to minimize risk of transmitting infections, such as malaria. The donated blood is tested for signs of infectious diseases, such as HIV and hepatitis. The blood is then tested to be sure it is compatible with you in order to minimize the chance of a transfusion reaction. If you or a relative donates blood, this is often done in anticipation of surgery and is not appropriate for emergency situations. It takes many days to process the donated blood. RISKS AND COMPLICATIONS Although transfusion therapy is very safe and saves many lives, the main dangers of transfusion include:   Getting an infectious disease.  Developing a transfusion reaction. This is an allergic reaction to something in the blood you were given. Every precaution is taken to prevent this. The decision to have a blood transfusion has been considered carefully by your caregiver before blood is given. Blood is not given unless the benefits outweigh the risks. AFTER THE TRANSFUSION  Right after receiving a blood transfusion, you will usually feel much better and more energetic. This is especially true if your red blood  cells have gotten low (anemic). The transfusion raises the level of the red blood cells which carry oxygen, and this usually causes an energy increase.  The nurse administering the transfusion will monitor you carefully for complications. HOME CARE INSTRUCTIONS  No special instructions are needed after a transfusion. You may find your energy is better. Speak with your caregiver about any limitations on activity for underlying diseases you may have. SEEK MEDICAL CARE IF:   Your condition is not improving after your transfusion.  You develop redness or irritation at the intravenous (IV) site. SEEK IMMEDIATE MEDICAL CARE IF:  Any of the following symptoms occur over the next 12 hours:  Shaking chills.  You have a  temperature by mouth above 102 F (38.9 C), not controlled by medicine.  Chest, back, or muscle pain.  People around you feel you are not acting correctly or are confused.  Shortness of breath or difficulty breathing.  Dizziness and fainting.  You get a rash or develop hives.  You have a decrease in urine output.  Your urine turns a dark color or changes to pink, red, or brown. Any of the following symptoms occur over the next 10 days:  You have a temperature by mouth above 102 F (38.9 C), not controlled by medicine.  Shortness of breath.  Weakness after normal activity.  The white part of the eye turns yellow (jaundice).  You have a decrease in the amount of urine or are urinating less often.  Your urine turns a dark color or changes to pink, red, or brown. Document Released: 03/30/2000 Document Revised: 06/25/2011 Document Reviewed: 11/17/2007 [facility] Patient Information 2014 [place], [place].  _______________________________________________________________________

## 2019-07-31 NOTE — Telephone Encounter (Signed)
   [facility] Pre-operative Risk Assessment    Request for surgical clearance:  1. What type of surgery is being performed? LEFT KNEE ARTHROPLASTY   2. When is this surgery scheduled? 08/17/19   3. What type of clearance is required (medical clearance vs. Pharmacy clearance to hold med vs. Both)? BOTH  4. Are there any medications that need to be held prior to surgery and how long? [place]   5. Practice name and name of physician performing surgery? [facility]; DR. FRANK ROWAN   6. What is your office phone number [phone]   7.   What is your office fax number [phone]  8.   Anesthesia type (None, local, MAC, general) ? SPINAL   Julaine Hua 07/31/2019, 3:06 PM  _________________________________________________________________   (provider comments below)

## 2019-08-03 DIAGNOSIS — I4821 Permanent atrial fibrillation: Secondary | ICD-10-CM | POA: Diagnosis not present

## 2019-08-03 DIAGNOSIS — M179 Osteoarthritis of knee, unspecified: Secondary | ICD-10-CM | POA: Diagnosis not present

## 2019-08-03 DIAGNOSIS — I10 Essential (primary) hypertension: Secondary | ICD-10-CM | POA: Diagnosis not present

## 2019-08-03 NOTE — Telephone Encounter (Signed)
   Primary Cardiologist: Will Meredith Leeds, MD  Chart reviewed as part of pre-operative protocol coverage. Patient was contacted 08/03/2019 in reference to pre-operative risk assessment for pending surgery as outlined below.  Bradley Roberson was last seen on 06/18/19 by Dr. Curt Bears.  Since that day, Bradley Roberson has done well. Easily getting > 4 mets of activity.   Therefore, based on ACC/AHA guidelines, the patient would be at acceptable risk for the planned procedure without further cardiovascular testing.   Pharmacy to review anticoagulation.   [place], [place] 08/03/2019, 9:25 AM

## 2019-08-03 NOTE — Telephone Encounter (Signed)
Pt takes Xarelto for afib with CHADS2VASc score of 3 (age x2, HTN). Renal function was normal when last checked in June 2019, do not see more recent labs than this. He is scheduled for pre-admission testing labs tomorrow and should have CBC and BMET checked at that time.  If renal function is normal on tomorrow labs, ok to hold Xarelto for 3 days prior to TKA per protocol.

## 2019-08-04 ENCOUNTER — Encounter (HOSPITAL_COMMUNITY)
Admission: RE | Admit: 2019-08-04 | Discharge: 2019-08-04 | Disposition: A | Discharge status: Home / Self Care | Payer: Medicare Other | Source: Ambulatory Visit | Attending: Orthopedic Surgery | Admitting: Orthopedic Surgery

## 2019-08-04 ENCOUNTER — Encounter (HOSPITAL_COMMUNITY): Payer: Self-pay

## 2019-08-04 ENCOUNTER — Ambulatory Visit (HOSPITAL_COMMUNITY)
Admission: RE | Admit: 2019-08-04 | Discharge: 2019-08-04 | Disposition: A | Discharge status: Home / Self Care | Payer: Medicare Other | Source: Ambulatory Visit | Attending: Orthopedic Surgery | Admitting: Orthopedic Surgery

## 2019-08-04 ENCOUNTER — Other Ambulatory Visit: Payer: Self-pay

## 2019-08-04 DIAGNOSIS — Z01818 Encounter for other preprocedural examination: Secondary | ICD-10-CM | POA: Insufficient documentation

## 2019-08-04 DIAGNOSIS — I10 Essential (primary) hypertension: Secondary | ICD-10-CM | POA: Diagnosis not present

## 2019-08-04 DIAGNOSIS — Z79899 Other long term (current) drug therapy: Secondary | ICD-10-CM | POA: Diagnosis not present

## 2019-08-04 DIAGNOSIS — Z7901 Long term (current) use of anticoagulants: Secondary | ICD-10-CM | POA: Insufficient documentation

## 2019-08-04 DIAGNOSIS — M1712 Unilateral primary osteoarthritis, left knee: Secondary | ICD-10-CM | POA: Diagnosis not present

## 2019-08-04 DIAGNOSIS — I517 Cardiomegaly: Secondary | ICD-10-CM | POA: Diagnosis not present

## 2019-08-04 DIAGNOSIS — Z96641 Presence of right artificial hip joint: Secondary | ICD-10-CM | POA: Insufficient documentation

## 2019-08-04 DIAGNOSIS — I4891 Unspecified atrial fibrillation: Secondary | ICD-10-CM | POA: Insufficient documentation

## 2019-08-04 HISTORY — DX: Cardiac arrhythmia, unspecified: I49.9

## 2019-08-04 LAB — URINALYSIS, ROUTINE W REFLEX MICROSCOPIC
Bilirubin Urine: NEGATIVE
Glucose, UA: NEGATIVE mg/dL
Hgb urine dipstick: NEGATIVE
Ketones, ur: NEGATIVE mg/dL
Leukocytes,Ua: NEGATIVE
Nitrite: NEGATIVE
Protein, ur: NEGATIVE mg/dL
Specific Gravity, Urine: 1.019 (ref 1.005–1.030)
pH: 7 (ref 5.0–8.0)

## 2019-08-04 LAB — BASIC METABOLIC PANEL
Anion gap: 5 (ref 5–15)
BUN: 18 mg/dL (ref 8–23)
CO2: 30 mmol/L (ref 22–32)
Calcium: 8.6 mg/dL — ABNORMAL LOW (ref 8.9–10.3)
Chloride: 108 mmol/L (ref 98–111)
Creatinine, Ser: 0.84 mg/dL (ref 0.61–1.24)
GFR calc Af Amer: >60 mL/min (ref >60)
GFR calc non Af Amer: >60 mL/min (ref >60)
Glucose, Bld: 96 mg/dL (ref 70–99)
Potassium: 4.4 mmol/L (ref 3.5–5.1)
Sodium: 143 mmol/L (ref 135–145)

## 2019-08-04 LAB — CBC WITH DIFFERENTIAL/PLATELET
Abs Immature Granulocytes: 0.01 10*3/uL (ref 0.00–0.07)
Basophils Absolute: 0 10*3/uL (ref 0.0–0.1)
Basophils Relative: 0 %
Eosinophils Absolute: 0.1 10*3/uL (ref 0.0–0.5)
Eosinophils Relative: 1 %
HCT: 39 % (ref 39.0–52.0)
Hemoglobin: 13.3 g/dL (ref 13.0–17.0)
Immature Granulocytes: 0 %
Lymphocytes Relative: 23 %
Lymphs Abs: 1.2 10*3/uL (ref 0.7–4.0)
MCH: 32.3 pg (ref 26.0–34.0)
MCHC: 34.1 g/dL (ref 30.0–36.0)
MCV: 94.7 fL (ref 80.0–100.0)
Monocytes Absolute: 0.3 10*3/uL (ref 0.1–1.0)
Monocytes Relative: 6 %
Neutro Abs: 3.7 10*3/uL (ref 1.7–7.7)
Neutrophils Relative %: 70 %
Platelets: 239 10*3/uL (ref 150–400)
RBC: 4.12 MIL/uL — ABNORMAL LOW (ref 4.22–5.81)
RDW: 12.8 % (ref 11.5–15.5)
WBC: 5.3 10*3/uL (ref 4.0–10.5)
nRBC: 0 % (ref 0.0–0.2)

## 2019-08-04 LAB — APTT: aPTT: 41 seconds — ABNORMAL HIGH (ref 24–36)

## 2019-08-04 LAB — PROTIME-INR
INR: 2.4 — ABNORMAL HIGH (ref 0.8–1.2)
Prothrombin Time: 25.9 seconds — ABNORMAL HIGH (ref 11.4–15.2)

## 2019-08-04 LAB — SURGICAL PCR SCREEN
MRSA, PCR: NEGATIVE
Staphylococcus aureus: NEGATIVE

## 2019-08-04 NOTE — Progress Notes (Addendum)
PCP - Gaynelle Arabian  Cardiologist - Dr. Barnie Del 06-18-19 epic clearance 08-03-19 epic   Chest x-ray -  EKG - 06-18-19 epic Stress Test -  ECHO - 2017 Cardiac Cath -   Sleep Study -  CPAP -   Fasting Blood Sugar -  Checks Blood Sugar _____ times a day  Blood Thinner Instructions:Xarelto if renal function ok at pst note ok to hold 3 days prior to surgery per pharmacy note 08-03-19 epic Aspirin Instructions: Last Dose:  Anesthesia review: a fib, PT 25.9, inr 2.4, ptt 41  Patient denies shortness of breath, fever, cough and chest pain at PAT appointment   none   Patient verbalized understanding of instructions that were given to them at the PAT appointment. Patient was also instructed that they will need to review over the PAT instructions again at home before surgery.

## 2019-08-05 LAB — TYPE AND SCREEN
ABO/RH(D): A POS
Antibody Screen: NEGATIVE

## 2019-08-05 LAB — ABO/RH: ABO/RH(D): A POS

## 2019-08-05 NOTE — Progress Notes (Signed)
Anesthesia Chart Review   Case: [id number] Date/Time: 08/17/19 0700   Procedure: LEFT TOTAL KNEE ARTHROPLASTY (Left Knee)   Anesthesia type: Spinal   Pre-op diagnosis: LEFT KNEE DEGERNATIVE JOINT DISESAE   Location: [place] / [facility]   Surgeons: [name], MD      DISCUSSION:78 y.o. never smoker with h/o HTN, A-fib, left knee djd scheduled for above procedure 08/17/2019 with Dr. [name].   Per cardiology 08/05/19, "Chart reviewed as part of pre-operative protocol coverage. Given past medical history and time since last visit, based on ACC/AHA guidelines, Bradley Roberson would be at acceptable risk for the planned procedure without further cardiovascular testing. I have reviewed labs drawn on 08/04/2019 and renal function is normal. No evidence of anemia. Elevated INR from Xarelto.  Per Pharmacy: Pt takes Xarelto for afib with CHADS2VASc score of 3 (age x2, HTN).  If renal function is normal on tomorrow labs, ok to hold Xarelto for 3 days prior to TKA per protocol. Will need to hold Xarelto for 3 days prior to the procedure."  Anticipate pt can proceed with planned procedure barring acute status change.    VS: BP 120/81   Pulse 73   Temp 36.9 C (Oral)   Resp 18   Ht 6\' 1"  (1.854 m)   Wt 95.8 kg   SpO2 99%   BMI 27.85 kg/m   PROVIDERS: Gaynelle Arabian, MD is PCP   Allegra Lai, MD is Cardiologist  LABS: Labs reviewed: Acceptable for surgery. (all labs ordered are listed, but only abnormal results are displayed)  Labs Reviewed - No data to display   IMAGES:   EKG: 06/18/2019 atrial fibrillation, rate 92  CV: Echo 09/28/2015 Study Conclusions   - Left ventricle: The cavity size was severely dilated. There was  mild focal basal hypertrophy of the septum. Systolic function was  mildly reduced. The estimated ejection fraction was in the range  of 45% to 50%. Diffuse hypokinesis.  - Aortic valve: Trileaflet; moderately thickened, moderately  calcified  leaflets. There was mild regurgitation.  - Mitral valve: Calcified annulus. There was trivial regurgitation.  - Left atrium: The appendage was severely dilated.  - Right ventricle: The cavity size was mildly dilated. Wall  thickness was normal. Systolic function was normal.  - Right atrium: The appendage was severely dilated.  - Atrial septum: No defect or patent foramen ovale was identified  by color flow Doppler.  - Tricuspid valve: There was mild regurgitation.  - Pulmonary arteries: Systolic pressure was within the normal  range. PA peak pressure: 34 mm Hg (S).  - Inferior vena cava: The vessel was dilated. The respirophasic  diameter changes were in the normal range (>= 50%), consistent  with elevated central venous pressure.   Impressions:   - Systolic function may be underestimated due to atrial  fibrillation.  Past Medical History:  Diagnosis Date  . Atrial fibrillation ([place]) 08/2015  . 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 Dysrhythmia    a-fib  . Erectile dysfunction   . Hypertension     Past Surgical History:  Procedure Laterality Date  . CARDIOVERSION N/A 11/10/2015   Procedure: CARDIOVERSION;  Surgeon: Larey Dresser, MD;  Location: [place];  Service: Cardiovascular;  Laterality: N/A;  . CATARACT EXTRACTION W/ INTRAOCULAR LENS IMPLANT  2018  . COLONOSCOPY  2006  . KNEE SURGERY     x 5   3 left knee 2 right knee  .  right anterior cruciate ligament tear    . TOTAL HIP ARTHROPLASTY Right 03/11/2017   Procedure: RIGHT TOTAL HIP ARTHROPLASTY ANTERIOR APPROACH;  Surgeon: [name], MD;  Location: [place];  Service: Orthopedics;  Laterality: Right;  Marland Kitchen VASECTOMY      MEDICATIONS: . amLODipine (NORVASC) 5 MG tablet  . benazepril (LOTENSIN) 40 MG tablet  . Cholecalciferol (VITAMIN D3) 50 MCG (2000 UT) TABS  . Multiple Vitamins-Minerals (ZINC PO)  . rivaroxaban (XARELTO) 20 MG TABS tablet  . sildenafil (REVATIO) 20 MG tablet    No current facility-administered medications for this encounter.    [facility] Pre-Surgical Testing [phone] 08/05/19  2:35 PM

## 2019-08-05 NOTE — Telephone Encounter (Signed)
   Primary Cardiologist: Will Meredith Leeds, MD  Chart reviewed as part of pre-operative protocol coverage. Given past medical history and time since last visit, based on ACC/AHA guidelines, EINER FERKO would be at acceptable risk for the planned procedure without further cardiovascular testing. I have reviewed labs drawn on 08/04/2019 and renal function is normal. No evidence of anemia. Elevated INR from Xarelto.   Per Pharmacy: Pt takes Xarelto for afib with CHADS2VASc score of 3 (age x2, HTN).  If renal function is normal on tomorrow labs, ok to hold Xarelto for 3 days prior to TKA per protocol.  Will need to hold Xarelto for 3 days prior to the procedure.   I will route this recommendation to the requesting party via Epic fax function and remove from pre-op pool.  Please call with questions.  Phill Myron. West Pugh, ANP, AACC  08/05/2019, 9:49 AM

## 2019-08-13 ENCOUNTER — Other Ambulatory Visit (HOSPITAL_COMMUNITY)
Admission: RE | Admit: 2019-08-13 | Discharge: 2019-08-13 | Disposition: A | Discharge status: Home / Self Care | Payer: Medicare Other | Source: Ambulatory Visit | Attending: Orthopedic Surgery | Admitting: Orthopedic Surgery

## 2019-08-13 DIAGNOSIS — Z01812 Encounter for preprocedural laboratory examination: Secondary | ICD-10-CM | POA: Diagnosis not present

## 2019-08-13 DIAGNOSIS — Z20822 Contact with and (suspected) exposure to covid-19: Secondary | ICD-10-CM | POA: Diagnosis not present

## 2019-08-13 LAB — SARS CORONAVIRUS 2 (TAT 6-24 HRS): SARS Coronavirus 2: NEGATIVE

## 2019-08-14 DIAGNOSIS — M1712 Unilateral primary osteoarthritis, left knee: Secondary | ICD-10-CM | POA: Diagnosis present

## 2019-08-14 NOTE — H&P (Signed)
TOTAL KNEE ADMISSION H&P  Patient is being admitted for left total knee arthroplasty.  Subjective:  Chief Complaint:left knee pain.  HPI: Bradley Roberson, 79 y.o. male, has a history of pain and functional disability in the left knee due to arthritis and has failed non-surgical conservative treatments for greater than 12 weeks to includeNSAID's and/or analgesics, weight reduction as appropriate and activity modification.  Onset of symptoms was gradual, starting several years ago with gradually worsening course since that time. The patient noted no past surgery on the left knee(s).  Patient currently rates pain in the left knee(s) at 10 out of 10 with activity. Patient has night pain, worsening of pain with activity and weight bearing, pain that interferes with activities of daily living, pain with passive range of motion, crepitus and joint swelling.  Patient has evidence of joint subluxation and joint space narrowing by imaging studies.  There is no active infection.  Patient Active Problem List   Diagnosis Date Noted  . Primary osteoarthritis of right hip 03/11/2017  . Degenerative joint disease (DJD) of hip   . Erectile dysfunction   . Atrial fibrillation ([place]) 08/15/2015   Past Medical History:  Diagnosis Date  . Atrial fibrillation ([place]) 08/2015  . 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 Dysrhythmia    a-fib  . Erectile dysfunction   . Hypertension     Past Surgical History:  Procedure Laterality Date  . CARDIOVERSION N/A 11/10/2015   Procedure: CARDIOVERSION;  Surgeon: Larey Dresser, MD;  Location: [place];  Service: Cardiovascular;  Laterality: N/A;  . CATARACT EXTRACTION W/ INTRAOCULAR LENS IMPLANT  2018  . COLONOSCOPY  2006  . KNEE SURGERY     x 5   3 left knee 2 right knee  . right anterior cruciate ligament tear    . TOTAL HIP ARTHROPLASTY Right 03/11/2017   Procedure: RIGHT TOTAL HIP ARTHROPLASTY ANTERIOR APPROACH;  Surgeon: [name], MD;   Location: [place];  Service: Orthopedics;  Laterality: Right;  Marland Kitchen VASECTOMY      No current facility-administered medications for this encounter.   Current Outpatient Medications  Medication Sig Dispense Refill Last Dose  . amLODipine (NORVASC) 5 MG tablet Take 5 mg daily by mouth.     . benazepril (LOTENSIN) 40 MG tablet Take 1 tablet (40 mg total) by mouth daily. 90 tablet 3   . Cholecalciferol (VITAMIN D3) 50 MCG (2000 UT) TABS Take 2,000 Units by mouth daily.     . Multiple Vitamins-Minerals (ZINC PO) Take 1 tablet by mouth daily.     . rivaroxaban (XARELTO) 20 MG TABS tablet Take 1 tablet (20 mg total) by mouth daily.     . sildenafil (REVATIO) 20 MG tablet Take 20 mg by mouth daily as needed (sexual activity).       No Known Allergies  Social History   Tobacco Use  . Smoking status: Never Smoker  . Smokeless tobacco: Never Used  Substance Use Topics  . Alcohol use: Yes    Alcohol/week: 3.0 standard drinks    Types: 3 Cans of beer per week    Comment: occasional    Family History  Problem Relation Age of Onset  . Hypertension Mother   . Cirrhosis Father   . Hemochromatosis Father   . Liver cancer Father      Review of Systems  Constitutional: Negative.   HENT: Negative.   Eyes: Negative.   Respiratory: Negative.   Cardiovascular:  HTN Irregular heart beat  Gastrointestinal: Negative.   Endocrine: Negative.   Genitourinary:       ED  Musculoskeletal: Positive for arthralgias.  Skin: Negative.   Allergic/Immunologic: Negative.   Neurological: Negative.   Hematological: Bruises/bleeds easily.    Objective:  Physical Exam  Constitutional: He is oriented to person, place, and time. He appears well-developed and well-nourished.  HENT:  Head: Normocephalic and atraumatic.  Eyes: Pupils are equal, round, and reactive to light.  Cardiovascular: Intact distal pulses.  Respiratory: Effort normal.  Musculoskeletal:        General: Tenderness present.      Cervical back: Normal range of motion and neck supple.     Comments: Obvious varus deformity to the left knee tender along the medial and lateral joint lines 10 forward flexion contracture flexion limited to 120.  1+ laxity to varus valgus testing.  Skin is intact neurovascular intact distally.  Neurological: He is alert and oriented to person, place, and time.  Skin: Skin is warm and dry.  Psychiatric: He has a normal mood and affect. His behavior is normal. Judgment and thought content normal.    Vital signs in last 24 hours:    Labs:   Estimated body mass index is 27.85 kg/m as calculated from the following:   Height as of 08/04/19: 6\' 1"  (1.854 m).   Weight as of 08/04/19: 95.8 kg.   Imaging Review Plain radiographs demonstrate AP Rosenberg lateral and sunrise x-rays show end-stage arthritis of the left greater than right knee.  The right knee is bone-on-bone to the medial compartment and is a laterally subluxated so much that the tibial spine is actually impinging on the lateral femoral condyle bare bone.      Assessment/Plan:  End stage arthritis, left knee   The patient history, physical examination, clinical judgment of the provider and imaging studies are consistent with end stage degenerative joint disease of the left knee(s) and total knee arthroplasty is deemed medically necessary. The treatment options including medical management, injection therapy arthroscopy and arthroplasty were discussed at length. The risks and benefits of total knee arthroplasty were presented and reviewed. The risks due to aseptic loosening, infection, stiffness, patella tracking problems, thromboembolic complications and other imponderables were discussed. The patient acknowledged the explanation, agreed to proceed with the plan and consent was signed. Patient is being admitted for inpatient treatment for surgery, pain control, PT, OT, prophylactic antibiotics, VTE prophylaxis, progressive  ambulation and ADL's and discharge planning. The patient is planning to be discharged home with home health services     Patient's anticipated LOS is less than 2 midnights, meeting these requirements: - Younger than 66 - Lives within 1 hour of care - Has a competent adult at home to recover with post-op recover - NO history of  - Chronic pain requiring opiods  - Diabetes  - Coronary Artery Disease  - Heart failure  - Heart attack  - Stroke  - DVT/VTE  - Cardiac arrhythmia  - Respiratory Failure/COPD  - Renal failure  - Anemia  - Advanced Liver disease

## 2019-08-16 MED ORDER — BUPIVACAINE LIPOSOME 1.3 % IJ SUSP
20.0000 mL | Freq: Once | INTRAMUSCULAR | Status: DC
  Filled 2019-08-16: qty 20

## 2019-08-16 MED ORDER — TRANEXAMIC ACID 1000 MG/10ML IV SOLN
2000.0000 mg | INTRAVENOUS | Status: DC
  Filled 2019-08-16: qty 20

## 2019-08-16 NOTE — Anesthesia Preprocedure Evaluation (Addendum)
Anesthesia Evaluation  Patient identified by MRN, date of birth, ID band Patient awake    Reviewed: Allergy & Precautions, NPO status , Patient's Chart, lab work & pertinent test results  History of Anesthesia Complications Negative for: history of anesthetic complications  Airway Mallampati: II  TM Distance: >3 FB Neck ROM: Full    Dental  (+) Teeth Intact   Pulmonary neg pulmonary ROS,    Pulmonary exam normal        Cardiovascular hypertension, Pt. on medications Normal cardiovascular exam+ dysrhythmias Atrial Fibrillation      Neuro/Psych negative neurological ROS  negative psych ROS   GI/Hepatic negative GI ROS, Neg liver ROS,   Endo/Other  negative endocrine ROS  Renal/GU negative Renal ROS  negative genitourinary   Musculoskeletal  (+) Arthritis , Osteoarthritis,    Abdominal   Peds  Hematology INR 2.4, likely secondary to xarelto, will need repeat on DOS   Anesthesia Other Findings  Per cardiology 08/05/19, "Chart reviewed as part of pre-operative protocol coverage. Given past medical history and time since last visit, based on ACC/AHA guidelines,Bradley R Watkinswould be at acceptable risk for the planned procedure without further cardiovascular testing. I have reviewed labs drawn on 08/04/2019 and renal function is normal. No evidence of anemia. Elevated INR from Xarelto.  Per Pharmacy: Pt takes Xarelto for afib with CHADS2VASc score of 3 (age x2, HTN).  If renal function is normal on tomorrow labs, ok to hold Xarelto for 3 days prior to TKA per protocol. Will need to hold Xarelto for 3 days prior to the procedure."  INR 2.4 on preop labs Plts 239  Reproductive/Obstetrics                            Anesthesia Physical Anesthesia Plan  ASA: III  Anesthesia Plan: Spinal   Post-op Pain Management:  Regional for Post-op pain   Induction:   PONV Risk Score and Plan: 1 and  Propofol infusion, Treatment may vary due to age or medical condition, Ondansetron and TIVA  Airway Management Planned: Nasal Cannula and Simple Face Mask  Additional Equipment: None  Intra-op Plan:   Post-operative Plan:   Informed Consent: I have reviewed the patients History and Physical, chart, labs and discussed the procedure including the risks, benefits and alternatives for the proposed anesthesia with the patient or authorized representative who has indicated his/her understanding and acceptance.       Plan Discussed with:   Anesthesia Plan Comments: ( Pt will require repeat INR showing normal value prior to spinal. Otherwise OK for GA.)      Anesthesia Quick Evaluation

## 2019-08-17 ENCOUNTER — Other Ambulatory Visit: Payer: Self-pay

## 2019-08-17 ENCOUNTER — Ambulatory Visit (HOSPITAL_COMMUNITY): Payer: Medicare Other | Admitting: Physician Assistant

## 2019-08-17 ENCOUNTER — Encounter (HOSPITAL_COMMUNITY)
Admission: RE | Disposition: A | Discharge status: Home / Self Care | Payer: Self-pay | Source: Ambulatory Visit | Attending: Orthopedic Surgery

## 2019-08-17 ENCOUNTER — Observation Stay (HOSPITAL_COMMUNITY)
Admission: RE | Admit: 2019-08-17 | Discharge: 2019-08-18 | Disposition: A | Discharge status: Home / Self Care | Payer: Medicare Other | Source: Ambulatory Visit | Attending: Orthopedic Surgery | Admitting: Orthopedic Surgery

## 2019-08-17 ENCOUNTER — Encounter (HOSPITAL_COMMUNITY): Payer: Self-pay | Admitting: Orthopedic Surgery

## 2019-08-17 DIAGNOSIS — Z7901 Long term (current) use of anticoagulants: Secondary | ICD-10-CM | POA: Insufficient documentation

## 2019-08-17 DIAGNOSIS — D62 Acute posthemorrhagic anemia: Secondary | ICD-10-CM | POA: Insufficient documentation

## 2019-08-17 DIAGNOSIS — G8918 Other acute postprocedural pain: Secondary | ICD-10-CM | POA: Diagnosis not present

## 2019-08-17 DIAGNOSIS — Z96641 Presence of right artificial hip joint: Secondary | ICD-10-CM | POA: Insufficient documentation

## 2019-08-17 DIAGNOSIS — M1712 Unilateral primary osteoarthritis, left knee: Secondary | ICD-10-CM | POA: Diagnosis not present

## 2019-08-17 DIAGNOSIS — I10 Essential (primary) hypertension: Secondary | ICD-10-CM | POA: Diagnosis not present

## 2019-08-17 DIAGNOSIS — N529 Male erectile dysfunction, unspecified: Secondary | ICD-10-CM | POA: Diagnosis not present

## 2019-08-17 DIAGNOSIS — I4891 Unspecified atrial fibrillation: Secondary | ICD-10-CM | POA: Insufficient documentation

## 2019-08-17 DIAGNOSIS — Z96651 Presence of right artificial knee joint: Secondary | ICD-10-CM

## 2019-08-17 DIAGNOSIS — Z79899 Other long term (current) drug therapy: Secondary | ICD-10-CM | POA: Insufficient documentation

## 2019-08-17 HISTORY — PX: TOTAL KNEE ARTHROPLASTY: SHX125

## 2019-08-17 LAB — PROTIME-INR
INR: 1.1 (ref 0.8–1.2)
Prothrombin Time: 13.5 seconds (ref 11.4–15.2)

## 2019-08-17 SURGERY — ARTHROPLASTY, KNEE, TOTAL
Anesthesia: Spinal | Site: Knee | Laterality: Left

## 2019-08-17 MED ORDER — METHOCARBAMOL 500 MG PO TABS
500.0000 mg | ORAL_TABLET | Freq: Four times a day (QID) | ORAL | Status: DC | PRN
  Administered 2019-08-17 – 2019-08-18 (×3): 500 mg via ORAL
  Filled 2019-08-17 (×3): qty 1

## 2019-08-17 MED ORDER — AMLODIPINE BESYLATE 5 MG PO TABS
5.0000 mg | ORAL_TABLET | Freq: Every day | ORAL | Status: DC
  Administered 2019-08-18: 5 mg via ORAL
  Filled 2019-08-17: qty 1

## 2019-08-17 MED ORDER — OXYCODONE HCL 5 MG/5ML PO SOLN: 5.0000 mg | Freq: Once | ORAL | Status: DC | PRN

## 2019-08-17 MED ORDER — TRANEXAMIC ACID-NACL 1000-0.7 MG/100ML-% IV SOLN
1000.0000 mg | INTRAVENOUS | Status: AC
  Administered 2019-08-17: 1000 mg via INTRAVENOUS
  Filled 2019-08-17: qty 100

## 2019-08-17 MED ORDER — MENTHOL 3 MG MT LOZG: 1.0000 | LOZENGE | OROMUCOSAL | Status: DC | PRN

## 2019-08-17 MED ORDER — SODIUM CHLORIDE 0.9 % IR SOLN
Status: DC | PRN
  Administered 2019-08-17: 1000 mL

## 2019-08-17 MED ORDER — DIPHENHYDRAMINE HCL 12.5 MG/5ML PO ELIX: 12.5000 mg | ORAL_SOLUTION | ORAL | Status: DC | PRN

## 2019-08-17 MED ORDER — METOCLOPRAMIDE HCL 5 MG/ML IJ SOLN: 5.0000 mg | Freq: Three times a day (TID) | INTRAMUSCULAR | Status: DC | PRN

## 2019-08-17 MED ORDER — PHENYLEPHRINE 40 MCG/ML (10ML) SYRINGE FOR IV PUSH (FOR BLOOD PRESSURE SUPPORT)
PREFILLED_SYRINGE | INTRAVENOUS | Status: AC
  Filled 2019-08-17: qty 10

## 2019-08-17 MED ORDER — ONDANSETRON HCL 4 MG/2ML IJ SOLN: 4.0000 mg | Freq: Once | INTRAMUSCULAR | Status: DC | PRN

## 2019-08-17 MED ORDER — PHENYLEPHRINE 40 MCG/ML (10ML) SYRINGE FOR IV PUSH (FOR BLOOD PRESSURE SUPPORT)
PREFILLED_SYRINGE | INTRAVENOUS | Status: DC | PRN
  Administered 2019-08-17 (×4): 80 ug via INTRAVENOUS

## 2019-08-17 MED ORDER — RIVAROXABAN 10 MG PO TABS
10.0000 mg | ORAL_TABLET | Freq: Every day | ORAL | Status: DC
  Administered 2019-08-18: 10 mg via ORAL
  Filled 2019-08-17: qty 1

## 2019-08-17 MED ORDER — SODIUM CHLORIDE (PF) 0.9 % IJ SOLN
INTRAMUSCULAR | Status: AC
  Filled 2019-08-17: qty 20

## 2019-08-17 MED ORDER — MIDAZOLAM HCL 5 MG/5ML IJ SOLN
INTRAMUSCULAR | Status: DC | PRN
  Administered 2019-08-17 (×2): 1 mg via INTRAVENOUS

## 2019-08-17 MED ORDER — DEXAMETHASONE SODIUM PHOSPHATE 10 MG/ML IJ SOLN
10.0000 mg | Freq: Once | INTRAMUSCULAR | Status: AC
  Administered 2019-08-18: 10 mg via INTRAVENOUS
  Filled 2019-08-17: qty 1

## 2019-08-17 MED ORDER — HYDROMORPHONE HCL 1 MG/ML IJ SOLN: 0.5000 mg | INTRAMUSCULAR | Status: DC | PRN

## 2019-08-17 MED ORDER — PANTOPRAZOLE SODIUM 40 MG PO TBEC
40.0000 mg | DELAYED_RELEASE_TABLET | Freq: Every day | ORAL | Status: DC
  Administered 2019-08-17 – 2019-08-18 (×2): 40 mg via ORAL
  Filled 2019-08-17 (×2): qty 1

## 2019-08-17 MED ORDER — VITAMIN D3 25 MCG (1000 UNIT) PO TABS
2000.0000 [IU] | ORAL_TABLET | Freq: Every day | ORAL | Status: DC
  Administered 2019-08-18: 2000 [IU] via ORAL
  Filled 2019-08-17 (×3): qty 2

## 2019-08-17 MED ORDER — OXYCODONE HCL 5 MG PO TABS: 5.0000 mg | ORAL_TABLET | Freq: Once | ORAL | Status: DC | PRN

## 2019-08-17 MED ORDER — BUPIVACAINE HCL (PF) 0.25 % IJ SOLN
INTRAMUSCULAR | Status: AC
  Filled 2019-08-17: qty 30

## 2019-08-17 MED ORDER — LACTATED RINGERS IV SOLN: INTRAVENOUS | Status: DC

## 2019-08-17 MED ORDER — TIZANIDINE HCL 2 MG PO TABS
2.0000 mg | ORAL_TABLET | Freq: Four times a day (QID) | ORAL | 0 refills | Status: DC | PRN
Start: 2019-08-17 — End: 2020-09-13

## 2019-08-17 MED ORDER — PROPOFOL 10 MG/ML IV BOLUS
INTRAVENOUS | Status: AC
  Filled 2019-08-17: qty 20

## 2019-08-17 MED ORDER — PROPOFOL 1000 MG/100ML IV EMUL
INTRAVENOUS | Status: AC
  Filled 2019-08-17: qty 100

## 2019-08-17 MED ORDER — FENTANYL CITRATE (PF) 100 MCG/2ML IJ SOLN
INTRAMUSCULAR | Status: AC
  Filled 2019-08-17: qty 2

## 2019-08-17 MED ORDER — DOCUSATE SODIUM 100 MG PO CAPS
100.0000 mg | ORAL_CAPSULE | Freq: Two times a day (BID) | ORAL | Status: DC
  Administered 2019-08-17 – 2019-08-18 (×2): 100 mg via ORAL
  Filled 2019-08-17 (×2): qty 1

## 2019-08-17 MED ORDER — PHENYLEPHRINE HCL (PRESSORS) 10 MG/ML IV SOLN
INTRAVENOUS | Status: AC
  Filled 2019-08-17: qty 1

## 2019-08-17 MED ORDER — POLYETHYLENE GLYCOL 3350 17 G PO PACK: 17.0000 g | PACK | Freq: Every day | ORAL | Status: DC | PRN

## 2019-08-17 MED ORDER — ROPIVACAINE HCL 5 MG/ML IJ SOLN
INTRAMUSCULAR | Status: DC | PRN
  Administered 2019-08-17: 30 mL via PERINEURAL

## 2019-08-17 MED ORDER — ONDANSETRON HCL 4 MG/2ML IJ SOLN: 4.0000 mg | Freq: Four times a day (QID) | INTRAMUSCULAR | Status: DC | PRN

## 2019-08-17 MED ORDER — DEXAMETHASONE SODIUM PHOSPHATE 10 MG/ML IJ SOLN
INTRAMUSCULAR | Status: DC | PRN
  Administered 2019-08-17: 10 mg via INTRAVENOUS

## 2019-08-17 MED ORDER — PHENOL 1.4 % MT LIQD: 1.0000 | OROMUCOSAL | Status: DC | PRN

## 2019-08-17 MED ORDER — BENAZEPRIL HCL 20 MG PO TABS
40.0000 mg | ORAL_TABLET | Freq: Every day | ORAL | Status: DC
  Administered 2019-08-18: 40 mg via ORAL
  Filled 2019-08-17: qty 2

## 2019-08-17 MED ORDER — WATER FOR IRRIGATION, STERILE IR SOLN
Status: DC | PRN
  Administered 2019-08-17: 2000 mL

## 2019-08-17 MED ORDER — POVIDONE-IODINE 10 % EX SWAB
2.0000 "application " | Freq: Once | CUTANEOUS | Status: AC
  Administered 2019-08-17: 2 via TOPICAL

## 2019-08-17 MED ORDER — LIDOCAINE 2% (20 MG/ML) 5 ML SYRINGE
INTRAMUSCULAR | Status: AC
  Filled 2019-08-17: qty 5

## 2019-08-17 MED ORDER — BUPIVACAINE-EPINEPHRINE (PF) 0.5% -1:200000 IJ SOLN
INTRAMUSCULAR | Status: AC
  Filled 2019-08-17: qty 30

## 2019-08-17 MED ORDER — CEFAZOLIN SODIUM-DEXTROSE 2-4 GM/100ML-% IV SOLN
2.0000 g | INTRAVENOUS | Status: AC
  Administered 2019-08-17: 2 g via INTRAVENOUS
  Filled 2019-08-17: qty 100

## 2019-08-17 MED ORDER — BUPIVACAINE LIPOSOME 1.3 % IJ SUSP
INTRAMUSCULAR | Status: DC | PRN
  Administered 2019-08-17: 20 mL

## 2019-08-17 MED ORDER — MIDAZOLAM HCL 2 MG/2ML IJ SOLN
INTRAMUSCULAR | Status: AC
  Filled 2019-08-17: qty 2

## 2019-08-17 MED ORDER — ACETAMINOPHEN 325 MG PO TABS: 325.0000 mg | ORAL_TABLET | Freq: Four times a day (QID) | ORAL | Status: DC | PRN

## 2019-08-17 MED ORDER — BUPIVACAINE IN DEXTROSE 0.75-8.25 % IT SOLN
INTRATHECAL | Status: DC | PRN
  Administered 2019-08-17: 2 mL via INTRATHECAL

## 2019-08-17 MED ORDER — METHOCARBAMOL 500 MG IVPB - SIMPLE MED
500.0000 mg | Freq: Four times a day (QID) | INTRAVENOUS | Status: DC | PRN
  Filled 2019-08-17: qty 50

## 2019-08-17 MED ORDER — ONDANSETRON HCL 4 MG/2ML IJ SOLN
INTRAMUSCULAR | Status: AC
  Filled 2019-08-17: qty 2

## 2019-08-17 MED ORDER — PHENYLEPHRINE HCL-NACL 10-0.9 MG/250ML-% IV SOLN
INTRAVENOUS | Status: DC | PRN
  Administered 2019-08-17: 30 ug/min via INTRAVENOUS

## 2019-08-17 MED ORDER — OXYCODONE-ACETAMINOPHEN 5-325 MG PO TABS: 1.0000 | ORAL_TABLET | ORAL | 0 refills | Status: DC | PRN

## 2019-08-17 MED ORDER — ONDANSETRON HCL 4 MG PO TABS
4.0000 mg | ORAL_TABLET | Freq: Four times a day (QID) | ORAL | Status: DC | PRN
  Administered 2019-08-18: 4 mg via ORAL
  Filled 2019-08-17: qty 1

## 2019-08-17 MED ORDER — GABAPENTIN 100 MG PO CAPS
100.0000 mg | ORAL_CAPSULE | Freq: Three times a day (TID) | ORAL | Status: DC
  Administered 2019-08-17 – 2019-08-18 (×3): 100 mg via ORAL
  Filled 2019-08-17 (×3): qty 1

## 2019-08-17 MED ORDER — DEXAMETHASONE SODIUM PHOSPHATE 10 MG/ML IJ SOLN
INTRAMUSCULAR | Status: AC
  Filled 2019-08-17: qty 1

## 2019-08-17 MED ORDER — SODIUM CHLORIDE (PF) 0.9 % IJ SOLN
INTRAMUSCULAR | Status: DC | PRN
  Administered 2019-08-17: 70 mL via INTRAVENOUS

## 2019-08-17 MED ORDER — BUPIVACAINE-EPINEPHRINE 0.5% -1:200000 IJ SOLN
INTRAMUSCULAR | Status: DC | PRN
  Administered 2019-08-17: 30 mL

## 2019-08-17 MED ORDER — ALUM & MAG HYDROXIDE-SIMETH 200-200-20 MG/5ML PO SUSP: 30.0000 mL | ORAL | Status: DC | PRN

## 2019-08-17 MED ORDER — PROPOFOL 500 MG/50ML IV EMUL
INTRAVENOUS | Status: DC | PRN
  Administered 2019-08-17: 50 ug/kg/min via INTRAVENOUS

## 2019-08-17 MED ORDER — TRANEXAMIC ACID-NACL 1000-0.7 MG/100ML-% IV SOLN
1000.0000 mg | Freq: Once | INTRAVENOUS | Status: AC
  Administered 2019-08-17: 1000 mg via INTRAVENOUS
  Filled 2019-08-17: qty 100

## 2019-08-17 MED ORDER — ONDANSETRON HCL 4 MG/2ML IJ SOLN
INTRAMUSCULAR | Status: DC | PRN
  Administered 2019-08-17: 4 mg via INTRAVENOUS

## 2019-08-17 MED ORDER — BISACODYL 5 MG PO TBEC: 5.0000 mg | DELAYED_RELEASE_TABLET | Freq: Every day | ORAL | Status: DC | PRN

## 2019-08-17 MED ORDER — SODIUM CHLORIDE (PF) 0.9 % IJ SOLN
INTRAMUSCULAR | Status: AC
  Filled 2019-08-17: qty 50

## 2019-08-17 MED ORDER — FLEET ENEMA 7-19 GM/118ML RE ENEM: 1.0000 | ENEMA | Freq: Once | RECTAL | Status: DC | PRN

## 2019-08-17 MED ORDER — METOCLOPRAMIDE HCL 5 MG PO TABS: 5.0000 mg | ORAL_TABLET | Freq: Three times a day (TID) | ORAL | Status: DC | PRN

## 2019-08-17 MED ORDER — KCL IN DEXTROSE-NACL 20-5-0.45 MEQ/L-%-% IV SOLN
INTRAVENOUS | Status: DC
  Filled 2019-08-17 (×3): qty 1000

## 2019-08-17 MED ORDER — LIDOCAINE 2% (20 MG/ML) 5 ML SYRINGE
INTRAMUSCULAR | Status: DC | PRN
  Administered 2019-08-17: 40 mg via INTRAVENOUS

## 2019-08-17 MED ORDER — FENTANYL CITRATE (PF) 100 MCG/2ML IJ SOLN: 25.0000 ug | INTRAMUSCULAR | Status: DC | PRN

## 2019-08-17 MED ORDER — FENTANYL CITRATE (PF) 100 MCG/2ML IJ SOLN
INTRAMUSCULAR | Status: DC | PRN
  Administered 2019-08-17 (×2): 50 ug via INTRAVENOUS

## 2019-08-17 MED ORDER — RIVAROXABAN 10 MG PO TABS
10.0000 mg | ORAL_TABLET | Freq: Every day | ORAL | 0 refills | Status: DC
Start: 2019-08-17 — End: 2021-09-08

## 2019-08-17 MED ORDER — TRANEXAMIC ACID 1000 MG/10ML IV SOLN
INTRAVENOUS | Status: DC | PRN
  Administered 2019-08-17: 08:00:00 2000 mg via TOPICAL

## 2019-08-17 MED ORDER — OXYCODONE HCL 5 MG PO TABS
5.0000 mg | ORAL_TABLET | ORAL | Status: DC | PRN
  Administered 2019-08-17: 5 mg via ORAL
  Administered 2019-08-17: 10 mg via ORAL
  Administered 2019-08-17: 5 mg via ORAL
  Administered 2019-08-18 (×2): 10 mg via ORAL
  Filled 2019-08-17 (×3): qty 2
  Filled 2019-08-17 (×2): qty 1

## 2019-08-17 SURGICAL SUPPLY — 57 items
ATTUNE MED DOME PAT 41 KNEE (Knees) ×1 IMPLANT
ATTUNE MED DOME PAT 41MM KNEE (Knees) ×1 IMPLANT
BAG DECANTER FOR FLEXI CONT (MISCELLANEOUS) ×3 IMPLANT
BAG SPEC THK2 15X12 ZIP CLS (MISCELLANEOUS) ×1
BAG ZIPLOCK 12X15 (MISCELLANEOUS) ×3 IMPLANT
BASE TIBIAL ROT PLAT SZ 10 KNE (Miscellaneous) IMPLANT
BLADE SAG 18X100X1.27 (BLADE) ×3 IMPLANT
BLADE SAW SGTL 11.0X1.19X90.0M (BLADE) ×3 IMPLANT
BLADE SURG SZ10 CARB STEEL (BLADE) ×6 IMPLANT
BNDG CMPR MED 10X6 ELC LF (GAUZE/BANDAGES/DRESSINGS) ×1
BNDG ELASTIC 6X10 VLCR STRL LF (GAUZE/BANDAGES/DRESSINGS) ×3 IMPLANT
BOWL SMART MIX CTS (DISPOSABLE) ×3 IMPLANT
BSPLAT TIB 10 CMNT ROT PLAT (Miscellaneous) ×1 IMPLANT
CEMENT HV SMART SET (Cement) ×8 IMPLANT
COMP FEM CMT ATTUNE SZ10 LT (Femur) ×3 IMPLANT
COMPONENT FEM CMT ATTN SZ10 LT (Femur) IMPLANT
COVER SURGICAL LIGHT HANDLE (MISCELLANEOUS) ×3 IMPLANT
COVER WAND RF STERILE (DRAPES) IMPLANT
CUFF TOURN SGL QUICK 34 (TOURNIQUET CUFF) ×3
CUFF TRNQT CYL 34X4.125X (TOURNIQUET CUFF) ×1 IMPLANT
DECANTER SPIKE VIAL GLASS SM (MISCELLANEOUS) ×9 IMPLANT
DRAPE U-SHAPE 47X51 STRL (DRAPES) ×3 IMPLANT
DRESSING AQUACEL AG SP 3.5X10 (GAUZE/BANDAGES/DRESSINGS) IMPLANT
DRSG AQUACEL AG ADV 3.5X10 (GAUZE/BANDAGES/DRESSINGS) ×3 IMPLANT
DRSG AQUACEL AG SP 3.5X10 (GAUZE/BANDAGES/DRESSINGS) ×3
DURAPREP 26ML APPLICATOR (WOUND CARE) ×3 IMPLANT
ELECT REM PT RETURN 15FT ADLT (MISCELLANEOUS) ×3 IMPLANT
GLOVE BIO SURGEON STRL SZ7.5 (GLOVE) ×3 IMPLANT
GLOVE BIO SURGEON STRL SZ8.5 (GLOVE) ×3 IMPLANT
GLOVE BIOGEL PI IND STRL 8 (GLOVE) ×1 IMPLANT
GLOVE BIOGEL PI IND STRL 9 (GLOVE) ×1 IMPLANT
GLOVE BIOGEL PI INDICATOR 8 (GLOVE) ×2
GLOVE BIOGEL PI INDICATOR 9 (GLOVE) ×2
GOWN STRL REUS W/TWL XL LVL3 (GOWN DISPOSABLE) ×6 IMPLANT
HANDPIECE INTERPULSE COAX TIP (DISPOSABLE) ×3
HOOD PEEL AWAY FLYTE STAYCOOL (MISCELLANEOUS) ×9 IMPLANT
INSERT TIB ATTUNE RP SZ10X5 (Insert) ×2 IMPLANT
KIT TURNOVER KIT A (KITS) IMPLANT
NDL HYPO 21X1.5 SAFETY (NEEDLE) ×2 IMPLANT
NEEDLE HYPO 21X1.5 SAFETY (NEEDLE) ×6 IMPLANT
NS IRRIG 1000ML POUR BTL (IV SOLUTION) ×3 IMPLANT
PACK ICE MAXI GEL EZY WRAP (MISCELLANEOUS) ×3 IMPLANT
PACK TOTAL KNEE CUSTOM (KITS) ×3 IMPLANT
PENCIL SMOKE EVACUATOR (MISCELLANEOUS) IMPLANT
PIN DRILL FIX HALF THREAD (BIT) ×2 IMPLANT
PIN STEINMAN FIXATION KNEE (PIN) ×2 IMPLANT
PROTECTOR NERVE ULNAR (MISCELLANEOUS) ×3 IMPLANT
SET HNDPC FAN SPRY TIP SCT (DISPOSABLE) ×1 IMPLANT
SUT VIC AB 1 CTX 36 (SUTURE) ×3
SUT VIC AB 1 CTX36XBRD ANBCTR (SUTURE) ×1 IMPLANT
SUT VIC AB 3-0 CT1 27 (SUTURE) ×9
SUT VIC AB 3-0 CT1 TAPERPNT 27 (SUTURE) ×3 IMPLANT
SYR CONTROL 10ML LL (SYRINGE) ×6 IMPLANT
TIBIAL BASE ROT PLAT SZ 10 KNE (Miscellaneous) ×3 IMPLANT
TRAY FOLEY MTR SLVR 16FR STAT (SET/KITS/TRAYS/PACK) ×3 IMPLANT
WATER STERILE IRR 1000ML POUR (IV SOLUTION) ×6 IMPLANT
YANKAUER SUCT BULB TIP 10FT TU (MISCELLANEOUS) ×3 IMPLANT

## 2019-08-17 NOTE — Discharge Instructions (Addendum)
INSTRUCTIONS AFTER JOINT REPLACEMENT   o Remove items at home which could result in a fall. This includes throw rugs or furniture in walking pathways o ICE to the affected joint every three hours while awake for 30 minutes at a time, for at least the first 3-5 days, and then as needed for pain and swelling.  Continue to use ice for pain and swelling. You may notice swelling that will progress down to the foot and ankle.  This is normal after surgery.  Elevate your leg when you are not up walking on it.   o Continue to use the breathing machine you got in the hospital (incentive spirometer) which will help keep your temperature down.  It is common for your temperature to cycle up and down following surgery, especially at night when you are not up moving around and exerting yourself.  The breathing machine keeps your lungs expanded and your temperature down.   DIET:  As you were doing prior to hospitalization, we recommend a well-balanced diet.  Information on my medicine - XARELTO (Rivaroxaban)  This medication education was reviewed with me or my healthcare representative as part of my discharge preparation.  The pharmacist that spoke with me during my hospital stay was:  Why was Xarelto prescribed for you? Xarelto was prescribed for you to reduce the risk of blood clots forming after orthopedic surgery. The medical term for these abnormal blood clots is venous thromboembolism (VTE).  What do you need to know about xarelto ? Take your Xarelto ONCE DAILY at the same time every day. You may take it either with or without food.  If you have difficulty swallowing the tablet whole, you may crush it and mix in applesauce just prior to taking your dose.  Take Xarelto exactly as prescribed by your doctor and DO NOT stop taking Xarelto without talking to the doctor who prescribed the medication.  Stopping without other VTE prevention medication to take the place of Xarelto may increase your risk  of developing a clot.  After discharge, you should have regular check-up appointments with your healthcare provider that is prescribing your Xarelto.    What do you do if you miss a dose? If you miss a dose, take it as soon as you remember on the same day then continue your regularly scheduled once daily regimen the next day. Do not take two doses of Xarelto on the same day.   Important Safety Information A possible side effect of Xarelto is bleeding. You should call your healthcare provider right away if you experience any of the following: ? Bleeding from an injury or your nose that does not stop. ? Unusual colored urine (red or dark brown) or unusual colored stools (red or black). ? Unusual bruising for unknown reasons. ? A serious fall or if you hit your head (even if there is no bleeding).  Some medicines may interact with Xarelto and might increase your risk of bleeding while on Xarelto. To help avoid this, consult your healthcare provider or pharmacist prior to using any new prescription or non-prescription medications, including herbals, vitamins, non-steroidal anti-inflammatory drugs (NSAIDs) and supplements.  This website has more information on Xarelto: [email or web address].[email or web address]    DRESSING / WOUND CARE / SHOWERING  Keep the surgical dressing until follow up.  The dressing is water proof, so you can shower without any extra covering.  IF THE DRESSING FALLS OFF or the wound gets wet inside, change the dressing with sterile gauze.  Please use good  hand washing techniques before changing the dressing.  Do not use any lotions or creams on the incision until instructed by your surgeon.    ACTIVITY  o Increase activity slowly as tolerated, but follow the weight bearing instructions below.   o No driving for 6 weeks or until further direction given by your physician.  You cannot drive while taking narcotics.  o No lifting or carrying greater than 10 lbs. until further directed by your  surgeon. o Avoid periods of inactivity such as sitting longer than an hour when not asleep. This helps prevent blood clots.  o You may return to work once you are authorized by your doctor.     WEIGHT BEARING   Weight bearing as tolerated with assist device (walker, cane, etc) as directed, use it as long as suggested by your surgeon or therapist, typically at least 4-6 weeks.   EXERCISES  Results after joint replacement surgery are often greatly improved when you follow the exercise, range of motion and muscle strengthening exercises prescribed by your doctor. Safety measures are also important to protect the joint from further injury. Any time any of these exercises cause you to have increased pain or swelling, decrease what you are doing until you are comfortable again and then slowly increase them. If you have problems or questions, call your caregiver or physical therapist for advice.   Rehabilitation is important following a joint replacement. After just a few days of immobilization, the muscles of the leg can become weakened and shrink (atrophy).  These exercises are designed to build up the tone and strength of the thigh and leg muscles and to improve motion. Often times heat used for twenty to thirty minutes before working out will loosen up your tissues and help with improving the range of motion but do not use heat for the first two weeks following surgery (sometimes heat can increase post-operative swelling).   These exercises can be done on a training (exercise) mat, on the floor, on a table or on a bed. Use whatever works the best and is most comfortable for you.    Use music or television while you are exercising so that the exercises are a pleasant break in your day. This will make your life better with the exercises acting as a break in your routine that you can look forward to.   Perform all exercises about fifteen times, three times per day or as directed.  You should exercise both  the operative leg and the other leg as well.  Exercises include:   . Quad Sets - Tighten up the muscle on the front of the thigh (Quad) and hold for 5-10 seconds.   . Straight Leg Raises - With your knee straight (if you were given a brace, keep it on), lift the leg to 60 degrees, hold for 3 seconds, and slowly lower the leg.  Perform this exercise against resistance later as your leg gets stronger.  . Leg Slides: Lying on your back, slowly slide your foot toward your buttocks, bending your knee up off the floor (only go as far as is comfortable). Then slowly slide your foot back down until your leg is flat on the floor again.  Glenard Haring Wings: Lying on your back spread your legs to the side as far apart as you can without causing discomfort.  . Hamstring Strength:  Lying on your back, push your heel against the floor with your leg straight by tightening up the muscles of your  buttocks.  Repeat, but this time bend your knee to a comfortable angle, and push your heel against the floor.  You may put a pillow under the heel to make it more comfortable if necessary.   A rehabilitation program following joint replacement surgery can speed recovery and prevent re-injury in the future due to weakened muscles. Contact your doctor or a physical therapist for more information on knee rehabilitation.    CONSTIPATION  Constipation is defined medically as fewer than three stools per week and severe constipation as less than one stool per week.  Even if you have a regular bowel pattern at home, your normal regimen is likely to be disrupted due to multiple reasons following surgery.  Combination of anesthesia, postoperative narcotics, change in appetite and fluid intake all can affect your bowels.   YOU MUST use at least one of the following options; they are listed in order of increasing strength to get the job done.  They are all available over the counter, and you may need to use some, POSSIBLY even all of these  options:    Drink plenty of fluids (prune juice may be helpful) and high fiber foods Colace 100 mg by mouth twice a day  Senokot for constipation as directed and as needed Dulcolax (bisacodyl), take with full glass of water  Miralax (polyethylene glycol) once or twice a day as needed.  If you have tried all these things and are unable to have a bowel movement in the first 3-4 days after surgery call either your surgeon or your primary doctor.    If you experience loose stools or diarrhea, hold the medications until you stool forms back up.  If your symptoms do not get better within 1 week or if they get worse, check with your doctor.  If you experience "the worst abdominal pain ever" or develop nausea or vomiting, please contact the office immediately for further recommendations for treatment.   ITCHING:  If you experience itching with your medications, try taking only a single pain pill, or even half a pain pill at a time.  You can also use Benadryl over the counter for itching or also to help with sleep.   TED HOSE STOCKINGS:  Use stockings on both legs until for at least 2 weeks or as directed by physician office. They may be removed at night for sleeping.  MEDICATIONS:  See your medication summary on the "After Visit Summary" that nursing will review with you.  You may have some home medications which will be placed on hold until you complete the course of blood thinner medication.  It is important for you to complete the blood thinner medication as prescribed.  PRECAUTIONS:  If you experience chest pain or shortness of breath - call 911 immediately for transfer to the hospital emergency department.   If you develop a fever greater that 101 F, purulent drainage from wound, increased redness or drainage from wound, foul odor from the wound/dressing, or calf pain - CONTACT YOUR SURGEON.                                                   FOLLOW-UP APPOINTMENTS:  If you do not already have a  post-op appointment, please call the office for an appointment to be seen by your surgeon.  Guidelines for how soon to be  seen are listed in your "After Visit Summary", but are typically between 1-4 weeks after surgery.  OTHER INSTRUCTIONS:   Knee Replacement:  Do not place pillow under knee, focus on keeping the knee straight while resting. CPM instructions: 0-90 degrees, 2 hours in the morning, 2 hours in the afternoon, and 2 hours in the evening. Place foam block, curve side up under heel at all times except when in CPM or when walking.  DO NOT modify, tear, cut, or change the foam block in any way.   DENTAL ANTIBIOTICS:  In most cases prophylactic antibiotics for Dental procdeures after total joint surgery are not necessary.  Exceptions are as follows:  1. History of prior total joint infection  2. Severely immunocompromised (Organ Transplant, cancer chemotherapy, Rheumatoid biologic meds such as [place])  3. Poorly controlled diabetes (A1C &gt; 8.0, blood glucose over 200)  If you have one of these conditions, contact your surgeon for an antibiotic prescription, prior to your dental procedure.   MAKE SURE YOU:  . Understand these instructions.  . Get help right away if you are not doing well or get worse.    Thank you for letting us be a part of your medical care team.  It is a privilege we respect greatly.  We hope these instructions will help you stay on track for a fast and full recovery!

## 2019-08-17 NOTE — Care Plan (Signed)
Ortho Bundle Case Management Note  Patient Details  Name: Bradley Roberson MRN: [id number] Date of Birth: [birth year]    Spoke with patient prior to surgery. He will discharge to home with family support. He has equipment at home. He prefers to go straight to OPPT. Set up at [facility] for 08/20/19. Patient and MD in agreement with plan               DME Arranged:    DME Agency:     HH Arranged:    [place] Agency:     Additional Comments: Please contact me with any questions of if this plan should need to change.  Ladell Heads,  [place] Specialist  [phone] 08/17/2019, 10:04 AM

## 2019-08-17 NOTE — Op Note (Signed)
PATIENT ID:      Bradley Roberson  MRN:     [id number] DOB/AGE:    [birth year] / 79 y.o.       OPERATIVE REPORT   DATE OF PROCEDURE:  08/17/2019      PREOPERATIVE DIAGNOSIS:   LEFT KNEE DEGERNATIVE JOINT DISEASE      Estimated body mass index is 27.85 kg/m as calculated from the following:   Height as of 08/04/19: 6\' 1"  (1.854 m).   Weight as of this encounter: 95.8 kg.                                                       POSTOPERATIVE DIAGNOSIS:   Same                                                                  PROCEDURE:  Procedure(s): LEFT TOTAL KNEE ARTHROPLASTY Using DepuyAttune RP implants #10L Femur, #10Tibia, 5 mm Attune RP bearing, 41 Patella    SURGEON: Kerin Salen  ASSISTANT:   Kerry Hough. [employer]   (Present and scrubbed throughout the case, critical for assistance with exposure, retraction, instrumentation, and closure.)        ANESTHESIA: spinal, 20cc Exparel, 50cc 0.25% Marcaine EBL: 300 cc FLUID REPLACEMENT: 1600 cc crystaloid TOURNIQUET: DRAINS: None TRANEXAMIC ACID: 1gm IV, 2gm topical COMPLICATIONS:  None         INDICATIONS FOR PROCEDURE: The patient has  LEFT KNEE DEGERNATIVE JOINT DISEASE, Nar deformities, XR shows bone on bone arthritis, lateral subluxation of tibia. Patient has failed all conservative measures including anti-inflammatory medicines, narcotics, attempts at exercise and weight loss, cortisone injections and viscosupplementation.  Risks and benefits of surgery have been discussed, questions answered.   DESCRIPTION OF PROCEDURE: The patient identified by armband, received  IV antibiotics, in the holding area at [facility]. Patient taken to the operating room, appropriate anesthetic monitors were attached, and Spinal anesthesia was  induced. IV Tranexamic acid was given.Tourniquet applied high to the operative thigh. Lateral post and foot positioner applied to the table, the lower extremity was then prepped and draped in usual sterile  fashion from the toes to the tourniquet. Time-out procedure was performed. Kerry Hough. [facility] PAC, was present and scrubbed throughout the case, critical for assistance with, positioning, exposure, retraction, instrumentation, and closure.The skin and subcutaneous tissue along the incision was injected with 20 cc of a mixture of Exparel and Marcaine solution, using a 20-gauge by 1-1/2 inch needle. We began the operation, with the knee flexed 130 degrees, by making the anterior midline incision starting at handbreadth above the patella, then joining up with the medial parapatellar incision from a meniscectomy that was done some 50 years ago and finally extending the incision down to 2 cm distal to and 1 cm medial to the tibial tubercle small bleeders in the skin and the subcutaneous tissue identified and cauterized. Transverse retinaculum was incised and reflected medially and a medial parapatellar arthrotomy was accomplished. the patella was everted and theprepatellar fat pad resected. The superficial medial collateral ligament was then elevated from anterior to posterior  along the proximal flare of the tibia and anterior half of the menisci resected. The knee was hyperflexed exposing bone on bone arthritis. Peripheral and notch osteophytes as well as the cruciate ligaments were then resected. We continued to work our way around posteriorly along the proximal tibia, and externally rotated the tibia subluxing it out from underneath the femur. A McHale PCL retractor was placed through the notch and a lateral Hohmann retractor placed, and we then entered the proximal tibia in line with the Depuy starter drill in line with the axis of the tibia followed by an intramedullary guide rod and 0-degree posterior slope cutting guide. The tibial cutting guide, 4 degree posterior sloped, was pinned into place allowing resection of 2 mm of bone medially and 12 mm of bone laterally. Satisfied with the tibial resection, we then  entered the distal femur 2 mm anterior to the PCL origin with the intramedullary guide rod and applied the distal femoral cutting guide set at 9 mm, with 5 degrees of valgus. This was pinned along the epicondylar axis. At this point, the distal femoral cut was accomplished without difficulty. We then sized for a #10R femoral component and pinned the guide in 3 degrees of external rotation, and translated the cutting guide anteriorly 2 mm to avoid any significant notching of the anterior femoral cortex.  Even with this 2 mm movement there was 1 to 2 mm of anterior notching.. The chamfer cutting guide was pinned into place. The anterior, posterior, and chamfer cuts were accomplished without difficulty followed by the Attune RP box cutting guide and the box cut. We also removed posterior osteophytes from the posterior femoral condyles. The posterior capsule was injected with Exparel solution. The knee was brought into full extension. We checked our extension gap and fit a 5 mm bearing. Distracting in extension with a lamina spreader,  bleeders in the posterior capsule, Posterior medial and posterior lateral gutter were cauterized.  The transexamic acid-soaked sponge was then placed in the gap of the knee in extension. The knee was flexed 30. The posterior patella cut was accomplished with the 9.5 mm Attune cutting guide, sized for a 70mm dome, and the fixation pegs drilled.The knee was then once again hyperflexed exposing the proximal tibia. We sized for a # 10 tibial base plate, applied the smokestack and the conical reamer followed by the the Delta fin keel punch. We then hammered into place the Attune RP trial femoral component, drilled the lugs, inserted a  5 mm trial bearing, trial patellar button, and took the knee through range of motion from 0-130 degrees. Medial and lateral ligamentous stability was checked. No thumb pressure was required for patellar Tracking. The tourniquet was not used. All trial  components were removed, mating surfaces irrigated with pulse lavage, and dried with suction and sponges. 10 cc of the Exparel solution was applied to the cancellus bone of the patella distal femur and proximal tibia.  After waiting 30 seconds, the bony surfaces were again, dried with sponges. A double batch of DePuy HV cement was mixed and applied to all bony metallic mating surfaces except for the posterior condyles of the femur itself. In order, we hammered into place the tibial tray and removed excess cement, the femoral component and removed excess cement. The final Attune RP bearing was inserted, and the knee brought to full extension with compression. The patellar button was clamped into place, and excess cement removed. The knee was held at 30 flexion with compression, while the  cement cured. The wound was irrigated out with normal saline solution pulse lavage. The rest of the Exparel was injected into the parapatellar arthrotomy, subcutaneous tissues, and periosteal tissues. The parapatellar arthrotomy was closed with running #1 Vicryl suture. The subcutaneous tissue with 3-0 undyed Vicryl suture, and the skin with running 3-0 SQ vicryl. An Aquacil and Ace wrap were applied. The patient was taken to recovery room without difficulty.   Kerin Salen 08/17/2019, 8:59 AM

## 2019-08-17 NOTE — Anesthesia Procedure Notes (Signed)
Date/Time: 08/17/2019 7:21 AM Performed by: Talbot Grumbling, CRNA Oxygen Delivery Method: Simple face mask

## 2019-08-17 NOTE — Progress Notes (Signed)
Orthopedic Tech Progress Note Patient Details:  Bradley Roberson Jun 01, 1940 [id number]  Ortho Devices Type of Ortho Device: Bone foam zero knee Ortho Device/Splint Interventions: Ordered   Post Interventions Instructions Provided: [facility] [place] 08/17/2019, 9:51 AM

## 2019-08-17 NOTE — Anesthesia Postprocedure Evaluation (Signed)
Anesthesia Post Note  Patient: Bradley Roberson  Procedure(s) Performed: LEFT TOTAL KNEE ARTHROPLASTY (Left Knee)     Patient location during evaluation: PACU Anesthesia Type: Spinal Level of consciousness: oriented and awake and alert Pain management: pain level controlled Vital Signs Assessment: post-procedure vital signs reviewed and stable Respiratory status: spontaneous breathing, respiratory function stable and nonlabored ventilation Cardiovascular status: blood pressure returned to baseline and stable Postop Assessment: no headache, no backache, no apparent nausea or vomiting and spinal receding Anesthetic complications: no    Last Vitals:  Vitals:   08/17/19 1000 08/17/19 1015  BP: 137/79 124/73  Pulse: 62 62  Resp: 16 11  Temp:    SpO2: 100% 100%    Last Pain:  Vitals:   08/17/19 1015  TempSrc:   PainSc: 0-No pain    LLE Motor Response: Purposeful movement (08/17/19 1015) LLE Sensation: Decreased;Numbness (08/17/19 1015) RLE Motor Response: Purposeful movement (08/17/19 1015) RLE Sensation: Decreased (08/17/19 1015) L Sensory Level: L5-Outer lower leg, top of foot, great toe (08/17/19 1015) R Sensory Level: S1-Sole of foot, small toes (08/17/19 1015)  Lidia Collum

## 2019-08-17 NOTE — Plan of Care (Signed)
Plan of care 

## 2019-08-17 NOTE — Transfer of Care (Signed)
Immediate Anesthesia Transfer of Care Note  Patient: Bradley Roberson  Procedure(s) Performed: LEFT TOTAL KNEE ARTHROPLASTY (Left Knee)  Patient Location: PACU  Anesthesia Type:Spinal  Level of Consciousness: awake, alert  and oriented  Airway & Oxygen Therapy: Patient Spontanous Breathing and Patient connected to face mask oxygen  Post-op Assessment: Report given to RN and Post -op Vital signs reviewed and stable  Post vital signs: Reviewed and stable  Last Vitals:  Vitals Value Taken Time  BP 134/91 08/17/19 0915  Temp    Pulse 72 08/17/19 0920  Resp 12 08/17/19 0920  SpO2 100 % 08/17/19 0920  Vitals shown include unvalidated device data.  Last Pain:  Vitals:   08/17/19 0616  TempSrc:   PainSc: 0-No pain         Complications: No apparent anesthesia complications

## 2019-08-17 NOTE — Evaluation (Signed)
Physical Therapy Evaluation Patient Details Name: Bradley Roberson MRN: [id number] DOB: 02-10-1941 Today's Date: 08/17/2019   History of Present Illness  Patient is 79 y.o. male s/p Lt TKA on 08/17/19 with PMH significant for HTN, dysrhythmia, A-fib, Rt THA in 2018, cardioversion in 2017.    Clinical Impression  Bradley Roberson is a 79 y.o. male POD 0 s/p Lt TKA. Patient reports independence with mobility at baseline. Patient is now limited by functional impairments (see PT problem list below) and requires min guard/assist for transfers and gait with RW. Patient was able to ambulate ~80 feet with RW and min assist. Patient instructed in exercise to facilitate ROM and circulation. Patient will benefit from continued skilled PT interventions to address impairments and progress towards PLOF. Acute PT will follow to progress mobility and stair training in preparation for safe discharge home.     Follow Up Recommendations Follow surgeon's recommendation for DC plan and follow-up therapies;Outpatient PT    Equipment Recommendations  None recommended by PT    Recommendations for Other Services       Precautions / Restrictions Precautions Precautions: Fall Restrictions Weight Bearing Restrictions: No Other Position/Activity Restrictions: WBAT      Mobility  Bed Mobility Overal bed mobility: Needs Assistance Bed Mobility: Supine to Sit     Supine to sit: Min assist;HOB elevated     General bed mobility comments: cues for sequencing and use of bed rail, light assist to bring Lt LE to EOB.  Transfers Overall transfer level: Needs assistance Equipment used: Rolling walker (2 wheeled) Transfers: Sit to/from Stand Sit to Stand: Min assist;From elevated surface         General transfer comment: cues for technique with RW, light assist to complete power up and steady with rising.  Ambulation/Gait Ambulation/Gait assistance: Min assist;Min guard Gait Distance (Feet): 80 Feet Assistive  device: Rolling walker (2 wheeled) Gait Pattern/deviations: Step-to pattern;Decreased stance time - left;Decreased weight shift to left Gait velocity: decreased   General Gait Details: cues for safe step pattern and walker proximity throughout, no overt LOB noted. light assist to manage walker initially and in turns.   Stairs            Wheelchair Mobility    Modified Rankin (Stroke Patients Only)       Balance Overall balance assessment: Needs assistance Sitting-balance support: Feet supported Sitting balance-Leahy Scale: Good     Standing balance support: Bilateral upper extremity supported;During functional activity Standing balance-Leahy Scale: Fair            Pertinent Vitals/Pain Pain Assessment: 0-10 Pain Score: 4  Pain Location: Lt knee Pain Descriptors / Indicators: Aching Pain Intervention(s): Limited activity within patient's tolerance;Monitored during session;Repositioned;Patient requesting pain meds-RN notified    Home Living Family/patient expects to be discharged to:: Private residence Living Arrangements: Spouse/significant other Available Help at Discharge: Family Type of Home: House Home Access: Stairs to enter   Technical brewer of Steps: 1 Home Layout: Two level;Bed/bath upstairs;Full bath on main level Home Equipment: Bedside commode;Walker - 2 wheels;Cane - single point      Prior Function Level of Independence: Independent         Comments: pt wants to get back to golfing     Hand Dominance   Dominant Hand: Right    Extremity/Trunk Assessment   Upper Extremity Assessment Upper Extremity Assessment: Overall WFL for tasks assessed    Lower Extremity Assessment Lower Extremity Assessment: Overall WFL for tasks assessed;LLE deficits/detail LLE Deficits /  Details: good quad activation, no extensor lag with SLR. LLE Sensation: WNL LLE Coordination: WNL    Cervical / Trunk Assessment Cervical / Trunk Assessment:  Normal  Communication   Communication: No difficulties  Cognition Arousal/Alertness: Awake/alert Behavior During Therapy: WFL for tasks assessed/performed Overall Cognitive Status: Within Functional Limits for tasks assessed         General Comments      Exercises Total Joint Exercises Ankle Circles/Pumps: AROM;Both;20 reps;Seated Quad Sets: AROM;5 reps;Seated;Left Heel Slides: AROM;Left;5 reps;Seated   Assessment/Plan    PT Assessment Patient needs continued PT services  PT Problem List Decreased strength;Decreased range of motion;Decreased activity tolerance;Decreased balance;Decreased mobility;Decreased knowledge of use of DME       PT Treatment Interventions DME instruction;Gait training;Stair training;Functional mobility training;Therapeutic exercise;Therapeutic activities;Patient/family education    PT Goals (Current goals can be found in the Care Plan section)  Acute Rehab PT Goals Patient Stated Goal: to get back to golf PT Goal Formulation: With patient Time For Goal Achievement: 08/24/19 Potential to Achieve Goals: Good    Frequency 7X/week    AM-PAC PT "6 Clicks" Mobility  Outcome Measure Help needed turning from your back to your side while in a flat bed without using bedrails?: A Little Help needed moving from lying on your back to sitting on the side of a flat bed without using bedrails?: A Little Help needed moving to and from a bed to a chair (including a wheelchair)?: A Little Help needed standing up from a chair using your arms (e.g., wheelchair or bedside chair)?: A Little Help needed to walk in hospital room?: A Little Help needed climbing 3-5 steps with a railing? : A Little 6 Click Score: 18    End of Session Equipment Utilized During Treatment: Gait belt Activity Tolerance: Patient tolerated treatment well Patient left: in chair;with call bell/phone within reach;with chair alarm set;with family/visitor present Nurse Communication: Mobility  status PT Visit Diagnosis: Muscle weakness (generalized) (M62.81);Difficulty in walking, not elsewhere classified (R26.2)    Time: [id number] PT Time Calculation (min) (ACUTE ONLY): 37 min   Charges:   PT Evaluation $PT Eval Low Complexity: 1 Low PT Treatments $Gait Training: 8-22 mins      Verner Mould, DPT Physical Therapist with [facility] [phone]  08/17/2019 5:07 PM

## 2019-08-17 NOTE — Anesthesia Procedure Notes (Addendum)
Spinal  Start time: 08/17/2019 7:15 AM End time: 08/17/2019 7:20 AM Staffing Performed: resident/CRNA  Anesthesiologist: Lidia Collum, MD Resident/CRNA: Talbot Grumbling, CRNA Preanesthetic Checklist Completed: patient identified, IV checked, risks and benefits discussed, surgical consent, monitors and equipment checked, pre-op evaluation and timeout performed Spinal Block Patient position: sitting Prep: ChloraPrep Patient monitoring: heart rate, cardiac monitor, continuous pulse ox and blood pressure Approach: midline Location: L3-4 Injection technique: single-shot Needle Needle type: Pencan  Needle gauge: 24 G Needle length: 9 cm Assessment Sensory level: T4 Additional Notes Clear CSF. No paresthesia, patient tolerated well

## 2019-08-17 NOTE — Addendum Note (Signed)
Addendum  created 08/17/19 1052 by Talbot Grumbling, CRNA   Clinical Note Signed, Intraprocedure Blocks edited

## 2019-08-17 NOTE — Plan of Care (Signed)
  Problem: Education: Goal: Knowledge of General Education information will improve Description Including pain rating scale, medication(s)/side effects and non-pharmacologic comfort measures Outcome: Progressing   Problem: Nutrition: Goal: Adequate nutrition will be maintained Outcome: Progressing   Problem: Pain Managment: Goal: General experience of comfort will improve Outcome: Progressing   

## 2019-08-17 NOTE — Anesthesia Procedure Notes (Signed)
Anesthesia Regional Block: Adductor canal block   Pre-Anesthetic Checklist: ,, timeout performed, Correct Patient, Correct Site, Correct Laterality, Correct Procedure, Correct Position, site marked, Risks and benefits discussed,  Surgical consent,  Pre-op evaluation,  At surgeon's request and post-op pain management  Laterality: Left  Prep: chloraprep       Needles:  Injection technique: Single-shot  Needle Type: Echogenic Stimulator Needle     Needle Length: 10cm  Needle Gauge: 21     Additional Needles:   Procedures:,,,, ultrasound used (permanent image in chart),,,,  Narrative:  Start time: 08/17/2019 6:58 AM End time: 08/17/2019 7:03 AM Injection made incrementally with aspirations every 5 mL.  Performed by: Personally  Anesthesiologist: Lidia Collum, MD  Additional Notes: Monitors applied. Injection made in 5cc increments. No resistance to injection. Good needle visualization. Patient tolerated procedure well.

## 2019-08-17 NOTE — Interval H&P Note (Signed)
History and Physical Interval Note:  08/17/2019 6:31 AM  Bradley Roberson  has presented today for surgery, with the diagnosis of LEFT KNEE [place].  The various methods of treatment have been discussed with the patient and family. After consideration of risks, benefits and other options for treatment, the patient has consented to  Procedure(s): LEFT TOTAL KNEE ARTHROPLASTY (Left) as a surgical intervention.  The patient's history has been reviewed, patient examined, no change in status, stable for surgery.  I have reviewed the patient's chart and labs.  Questions were answered to the patient's satisfaction.     Kerin Salen

## 2019-08-18 ENCOUNTER — Encounter (HOSPITAL_COMMUNITY): Payer: Self-pay | Admitting: Orthopedic Surgery

## 2019-08-18 DIAGNOSIS — I4891 Unspecified atrial fibrillation: Secondary | ICD-10-CM | POA: Diagnosis not present

## 2019-08-18 DIAGNOSIS — D62 Acute posthemorrhagic anemia: Secondary | ICD-10-CM | POA: Diagnosis not present

## 2019-08-18 DIAGNOSIS — Z79899 Other long term (current) drug therapy: Secondary | ICD-10-CM | POA: Diagnosis not present

## 2019-08-18 DIAGNOSIS — M1712 Unilateral primary osteoarthritis, left knee: Secondary | ICD-10-CM | POA: Diagnosis not present

## 2019-08-18 DIAGNOSIS — N529 Male erectile dysfunction, unspecified: Secondary | ICD-10-CM | POA: Diagnosis not present

## 2019-08-18 DIAGNOSIS — Z7901 Long term (current) use of anticoagulants: Secondary | ICD-10-CM | POA: Diagnosis not present

## 2019-08-18 LAB — BASIC METABOLIC PANEL
Anion gap: 9 (ref 5–15)
BUN: 13 mg/dL (ref 8–23)
CO2: 24 mmol/L (ref 22–32)
Calcium: 8.4 mg/dL — ABNORMAL LOW (ref 8.9–10.3)
Chloride: 107 mmol/L (ref 98–111)
Creatinine, Ser: 0.69 mg/dL (ref 0.61–1.24)
GFR calc Af Amer: >60 mL/min (ref >60)
GFR calc non Af Amer: >60 mL/min (ref >60)
Glucose, Bld: 170 mg/dL — ABNORMAL HIGH (ref 70–99)
Potassium: 4.7 mmol/L (ref 3.5–5.1)
Sodium: 140 mmol/L (ref 135–145)

## 2019-08-18 LAB — CBC
HCT: 35.7 % — ABNORMAL LOW (ref 39.0–52.0)
Hemoglobin: 12.3 g/dL — ABNORMAL LOW (ref 13.0–17.0)
MCH: 32.5 pg (ref 26.0–34.0)
MCHC: 34.5 g/dL (ref 30.0–36.0)
MCV: 94.4 fL (ref 80.0–100.0)
Platelets: 204 10*3/uL (ref 150–400)
RBC: 3.78 MIL/uL — ABNORMAL LOW (ref 4.22–5.81)
RDW: 12.5 % (ref 11.5–15.5)
WBC: 9 10*3/uL (ref 4.0–10.5)
nRBC: 0 % (ref 0.0–0.2)

## 2019-08-18 NOTE — Discharge Summary (Signed)
Patient ID: Bradley Roberson MRN: [id number] DOB/AGE: 04/20/1940 67 y.o.  Admit date: 08/17/2019 Discharge date: 08/18/2019  Admission Diagnoses:  Principal Problem:   Degenerative arthritis of left knee Active Problems:   Total knee replacement status, right   Discharge Diagnoses:  Same  Past Medical History:  Diagnosis Date  . Atrial fibrillation ([place]) 08/2015  . 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 Dysrhythmia    a-fib  . Erectile dysfunction   . Hypertension     Surgeries: Procedure(s): LEFT TOTAL KNEE ARTHROPLASTY on 08/17/2019   Consultants:   Discharged Condition: Improved  Hospital Course: Bradley Roberson is an 79 y.o. male who was admitted 08/17/2019 for operative treatment ofDegenerative arthritis of left knee. Patient has severe unremitting pain that affects sleep, daily activities, and work/hobbies. After pre-op clearance the patient was taken to the operating room on 08/17/2019 and underwent  Procedure(s): LEFT TOTAL KNEE ARTHROPLASTY.    Patient was given perioperative antibiotics:  Anti-infectives (From admission, onward)   Start     Dose/Rate Route Frequency Ordered Stop   08/17/19 0615  ceFAZolin (ANCEF) IVPB 2g/100 mL premix     2 g 200 mL/hr over 30 Minutes Intravenous On call to O.R. 08/17/19 0606 08/17/19 0730       Patient was given sequential compression devices, early ambulation, and chemoprophylaxis to prevent DVT.  Patient benefited maximally from hospital stay and there were no complications.    Recent vital signs:  Patient Vitals for the past 24 hrs:  BP Temp Temp src Pulse Resp SpO2 Height Weight  08/18/19 0623 (Abnormal) 149/99 97.6 F (36.4 C) no documentation 70 15 97 % no documentation no documentation  08/18/19 0146 131/84 97.9 F (36.6 C) no documentation (Abnormal) 58 14 97 % no documentation no documentation  08/17/19 2149 (Abnormal) 120/95 98 F (36.7 C) no documentation 93 14 95 % no documentation no  documentation  08/17/19 1840 132/85 (Abnormal) 97.5 F (36.4 C) no documentation 91 14 96 % no documentation no documentation  08/17/19 1502 128/86 (Abnormal) 97.5 F (36.4 C) Oral 71 14 98 % no documentation no documentation  08/17/19 1321 128/82 no documentation no documentation 68 16 100 % no documentation no documentation  08/17/19 1214 (Abnormal) 134/92 97.7 F (36.5 C) no documentation 75 14 100 % no documentation no documentation  08/17/19 1110 121/86 97.7 F (36.5 C) Oral 63 18 100 % 6\' 1"  (1.854 m) 95 kg  08/17/19 1045 124/76 97.6 F (36.4 C) no documentation (Abnormal) 50 16 99 % no documentation no documentation  08/17/19 1030 127/77 no documentation no documentation (Abnormal) 58 15 100 % no documentation no documentation  08/17/19 1015 124/73 no documentation no documentation 62 11 100 % no documentation no documentation  08/17/19 1000 137/79 no documentation no documentation 62 16 100 % no documentation no documentation  08/17/19 0945 134/81 no documentation no documentation 70 13 99 % no documentation no documentation  08/17/19 0930 136/84 no documentation no documentation 60 15 97 % no documentation no documentation  08/17/19 0915 (Abnormal) 134/91 (Abnormal) 97.4 F (36.3 C) no documentation 69 13 96 % no documentation no documentation     Recent laboratory studies:  Recent Labs    08/17/19 0621 08/18/19 0311  WBC  --  9.0  HGB  --  12.3*  HCT  --  35.7*  PLT  --  204  NA  --  140  K  --  4.7  CL  --  107  CO2  --  24  BUN  --  13  CREATININE  --  0.69  GLUCOSE  --  170*  INR 1.1  --   CALCIUM  --  8.4*     Discharge Medications:   Allergies as of 08/18/2019   No Known Allergies     Medication List    Take these medications   amLODipine 5 MG tablet Commonly known as: NORVASC Take 5 mg daily by mouth.   benazepril 40 MG tablet Commonly known as: LOTENSIN Take 1 tablet (40 mg total) by mouth daily.   oxyCODONE-acetaminophen 5-325 MG  tablet Commonly known as: PERCOCET/ROXICET Take 1 tablet by mouth every 4 (four) hours as needed for severe pain.   rivaroxaban 10 MG Tabs tablet Commonly known as: XARELTO Take 1 tablet (10 mg total) by mouth daily. Take 10 mg for 2 weeks and then restart normal 20 mg dose What changed:   medication strength  how much to take  additional instructions   sildenafil 20 MG tablet Commonly known as: REVATIO Take 20 mg by mouth daily as needed (sexual activity).   tiZANidine 2 MG tablet Commonly known as: ZANAFLEX Take 1 tablet (2 mg total) by mouth every 6 (six) hours as needed.   Vitamin D3 50 MCG (2000 UT) Tabs Take 2,000 Units by mouth daily.   ZINC PO Take 1 tablet by mouth daily.        Durable Medical Equipment  (From admission, onward)         Start     Ordered   08/17/19 1116  DME Walker rolling  Once    Question:  Patient needs a walker to treat with the following condition  Answer:  Status post total left knee replacement   08/17/19 1115   08/17/19 1116  DME 3 n 1  Once     08/17/19 1115           Discharge Care Instructions  (From admission, onward)         Start     Ordered   08/18/19 0000  Change dressing    Comments: Change dressing Only if drainage exceeds 40% of window on dressing   08/18/19 0810          Diagnostic Studies: DG Chest 2 View  Result Date: 08/04/2019 CLINICAL DATA:  Preop knee replacement. EXAM: CHEST - 2 VIEW COMPARISON:  02/28/2017 FINDINGS: Mild cardiomegaly, unchanged. Unchanged mediastinal contours with aortic tortuosity. No pulmonary edema, pleural effusion, or focal airspace disease. No acute osseous abnormalities are seen. IMPRESSION: Mild chronic cardiomegaly, unchanged from 2018. No acute abnormality. Electronically Signed   By: Keith Rake M.D.   On: 08/04/2019 23:45    Disposition: Discharge disposition: 01-Home or Self Care       Discharge Instructions    Call MD / Call 911   Complete by: As  directed    If you experience chest pain or shortness of breath, CALL 911 and be transported to the hospital emergency room.  If you develope a fever above 101 F, pus (white drainage) or increased drainage or redness at the wound, or calf pain, call your surgeon's office.   Change dressing   Complete by: As directed    Change dressing Only if drainage exceeds 40% of window on dressing   Constipation Prevention   Complete by: As directed    Drink plenty of fluids.  Prune juice may be helpful.  You may use a stool softener,  such as Colace (over the counter) 100 mg twice a day.  Use MiraLax (over the counter) for constipation as needed.   Diet - low sodium heart healthy   Complete by: As directed    Increase activity slowly as tolerated   Complete by: As directed       Follow-up Information    [name], MD. Go on 08/27/2019.   Specialty: Orthopedic Surgery Why: Your appointment has been scheduled for 9:00.  Contact information: [place] [address] [phone]        [facility], [place]. Go on 08/20/2019.   Why: You are scheduled to outpatient Physical therapy at 9:20. Please arrive at 9:00 to compete your paperwork.  Contact information: [facility] [place] [address] [phone]            Signed: Kerin Salen 08/18/2019, 8:11 AM

## 2019-08-18 NOTE — TOC Transition Note (Signed)
Transition of Care [facility]) - CM/SW Discharge Note   Patient Details  Name: Bradley Roberson MRN: [id number] Date of Birth: Jan 16, 1941  [facility]) CM/SW Contact:  Lia Hopping, [place] Phone Number: 08/18/2019, 10:54 AM   Clinical Narrative:    Patient confirm discharge plan of care.  No needs identified.    Final next level of care: OP Rehab Barriers to Discharge: No Barriers Identified   Patient Goals and CMS Choice     Choice offered to / list presented to : NA  Discharge Placement                       Discharge Plan and Services                DME Arranged: N/A DME Agency: NA                  Social Determinants of Health (SDOH) Interventions     Readmission Risk Interventions No flowsheet data found.

## 2019-08-18 NOTE — Progress Notes (Signed)
Physical Therapy Treatment Patient Details Name: Bradley Roberson MRN: [id number] DOB: 16-May-1940 Today's Date: 08/18/2019    History of Present Illness Patient is 79 y.o. male s/p Lt TKA on 08/17/19 with PMH significant for HTN, dysrhythmia, A-fib, Rt THA in 2018, cardioversion in 2017.    PT Comments    POD # 1 pm session Spouse present during session.  Assisted OOB to amb in hallway, practice one step then back to bed.  Addressed all mobility questions, discussed appropriate activity, educated on use of ICE.  Pt ready for D/C to home.   Follow Up Recommendations  Follow surgeon's recommendation for DC plan and follow-up therapies;Outpatient PT     Equipment Recommendations  None recommended by PT    Recommendations for Other Services       Precautions / Restrictions Precautions Precautions: Fall Precaution Comments: instructed no pillow under knee Restrictions Weight Bearing Restrictions: No Other Position/Activity Restrictions: WBAT    Mobility  Bed Mobility Overal bed mobility: Modified Independent Bed Mobility: Supine to Sit;Sit to Supine     Supine to sit: Modified independent (Device/Increase time) Sit to supine: Modified independent (Device/Increase time)   General bed mobility comments: increased time  Transfers Overall transfer level: Needs assistance Equipment used: Rolling walker (2 wheeled) Transfers: Sit to/from Stand Sit to Stand: Supervision;Min guard         General transfer comment: good safety cognition and use of hands to steady self  Ambulation/Gait Ambulation/Gait assistance: Supervision Gait Distance (Feet): 83 Feet Assistive device: Rolling walker (2 wheeled) Gait Pattern/deviations: Step-to pattern;Decreased stance time - left;Decreased weight shift to left Gait velocity: decreased   General Gait Details: increased time   Stairs Stairs: Yes Stairs assistance: Supervision;Min guard Stair Management: No rails Number of Stairs:  1 General stair comments: 25% VC's on proper sequencing and walker placement.  Also verbally instructed on proper tech to negociate a flight of stairs as pt's bedroom is upstairs.   Wheelchair Mobility    Modified Rankin (Stroke Patients Only)       Balance                                            Cognition Arousal/Alertness: Awake/alert Behavior During Therapy: WFL for tasks assessed/performed Overall Cognitive Status: Within Functional Limits for tasks assessed                                        Exercises      General Comments        Pertinent Vitals/Pain Pain Assessment: 0-10 Pain Score: 3  Pain Location: Lt knee Pain Descriptors / Indicators: Aching;Sore Pain Intervention(s): Monitored during session;Premedicated before session;Repositioned;Ice applied    Home Living                      Prior Function            PT Goals (current goals can now be found in the care plan section) Progress towards PT goals: Progressing toward goals    Frequency    7X/week      PT Plan Current plan remains appropriate    Co-evaluation              AM-PAC PT "6 Clicks" Mobility   Outcome Measure  Help  needed turning from your back to your side while in a flat bed without using bedrails?: None Help needed moving from lying on your back to sitting on the side of a flat bed without using bedrails?: None Help needed moving to and from a bed to a chair (including a wheelchair)?: A Little Help needed standing up from a chair using your arms (e.g., wheelchair or bedside chair)?: A Little Help needed to walk in hospital room?: A Little Help needed climbing 3-5 steps with a railing? : A Little 6 Click Score: 20    End of Session Equipment Utilized During Treatment: Gait belt Activity Tolerance: Patient tolerated treatment well Patient left: in bed;with call bell/phone within reach;with bed alarm set Nurse  Communication: Mobility status PT Visit Diagnosis: Muscle weakness (generalized) (M62.81);Difficulty in walking, not elsewhere classified (R26.2)     Time: 1358-1430 PT Time Calculation (min) (ACUTE ONLY): 32 min  Charges:  $Gait Training: 8-22 mins $Therapeutic Activity: 8-22 mins                     Rica Koyanagi  PTA Acute  Rehabilitation Services Pager      [phone] Office      [phone]

## 2019-08-18 NOTE — Progress Notes (Signed)
Physical Therapy Treatment Patient Details Name: Bradley Roberson MRN: [id number] DOB: 1941-03-21 Today's Date: 08/18/2019    History of Present Illness Patient is 79 y.o. male s/p Lt TKA on 08/17/19 with PMH significant for HTN, dysrhythmia, A-fib, Rt THA in 2018, cardioversion in 2017.    PT Comments    POD # 1 am session Assisted OOB to amb in [place] then perform a few TE's followed by ICE.    Follow Up Recommendations  Follow surgeon's recommendation for DC plan and follow-up therapies;Outpatient PT     Equipment Recommendations  None recommended by PT    Recommendations for Other Services       Precautions / Restrictions Precautions Precautions: Fall Precaution Comments: instructed no pillow under knee Restrictions Weight Bearing Restrictions: No Other Position/Activity Restrictions: WBAT    Mobility  Bed Mobility Overal bed mobility: Needs Assistance Bed Mobility: Supine to Sit     Supine to sit: Supervision;Min guard     General bed mobility comments: demonstarted and instructed how to use belt to assist LE  Transfers Overall transfer level: Needs assistance Equipment used: Rolling walker (2 wheeled) Transfers: Sit to/from Stand Sit to Stand: Supervision;Min guard         General transfer comment: <25% VC's on proper hand placement and safety with turns  Ambulation/Gait Ambulation/Gait assistance: Supervision;Min guard Gait Distance (Feet): 75 Feet Assistive device: Rolling walker (2 wheeled) Gait Pattern/deviations: Step-to pattern;Decreased stance time - left;Decreased weight shift to left Gait velocity: decreased   General Gait Details: cues for safe step pattern and walker proximity throughout, no overt LOB noted. Tolerated distance well.   Stairs             Wheelchair Mobility    Modified Rankin (Stroke Patients Only)       Balance                                            Cognition Arousal/Alertness:  Awake/alert Behavior During Therapy: WFL for tasks assessed/performed Overall Cognitive Status: Within Functional Limits for tasks assessed                                        Exercises   Total Knee Replacement TE's following HEP handout 10 reps B LE ankle pumps 05 reps towel squeezes 05 reps knee presses 05 reps heel slides   Educated on use of gait belt to assist with TE's Followed by ICE     General Comments        Pertinent Vitals/Pain Pain Assessment: 0-10 Pain Score: 3  Pain Location: Lt knee Pain Descriptors / Indicators: Aching;Sore Pain Intervention(s): Monitored during session;Premedicated before session;Repositioned;Ice applied    Home Living                      Prior Function            PT Goals (current goals can now be found in the care plan section) Progress towards PT goals: Progressing toward goals    Frequency    7X/week      PT Plan Current plan remains appropriate    Co-evaluation              AM-PAC PT "6 Clicks" Mobility   Outcome Measure  Help needed  turning from your back to your side while in a flat bed without using bedrails?: None Help needed moving from lying on your back to sitting on the side of a flat bed without using bedrails?: None Help needed moving to and from a bed to a chair (including a wheelchair)?: A Little Help needed standing up from a chair using your arms (e.g., wheelchair or bedside chair)?: A Little Help needed to walk in hospital room?: A Little Help needed climbing 3-5 steps with a railing? : A Little 6 Click Score: 20    End of Session Equipment Utilized During Treatment: Gait belt Activity Tolerance: Patient tolerated treatment well Patient left: in bed;with call bell/phone within reach;with bed alarm set Nurse Communication: Mobility status PT Visit Diagnosis: Muscle weakness (generalized) (M62.81);Difficulty in walking, not elsewhere classified (R26.2)     Time:  1005-1030 PT Time Calculation (min) (ACUTE ONLY): 25 min  Charges:  $Gait Training: 8-22 mins $Therapeutic Activity: 8-22 mins                     Rica Koyanagi  PTA Acute  Rehabilitation Services Pager      [phone] Office      [phone]

## 2019-08-18 NOTE — Progress Notes (Signed)
PATIENT ID: Bradley Roberson  MRN: [id number]  DOB/AGE:  05-26-1940 / 79 y.o.  1 Day Post-Op Procedure(s) (LRB): LEFT TOTAL KNEE ARTHROPLASTY (Left)    PROGRESS NOTE Subjective: Patient is alert, oriented, no Nausea, no Vomiting, yes passing gas. Taking PO well. Denies SOB, Chest or Calf Pain. Using Incentive Spirometer, PAS in place. Ambulate 80', Patient reports pain as 2/10 .    Objective: Vital signs in last 24 hours: Vitals:   08/17/19 1840 08/17/19 2149 08/18/19 0146 08/18/19 0623  BP: 132/85 (Abnormal) 120/95 131/84 (Abnormal) 149/99  Pulse: 91 93 (Abnormal) 58 70  Resp: 14 14 14 15   Temp: (Abnormal) 97.5 F (36.4 C) 98 F (36.7 C) 97.9 F (36.6 C) 97.6 F (36.4 C)  TempSrc:      SpO2: 96% 95% 97% 97%  Weight:      Height:          Intake/Output from previous day: I/O last 3 completed shifts: In: 3040 [P.O.:240; I.V.:2600; IV Piggyback:200] Out: 6600 [Urine:6300; Blood:300]   Intake/Output this shift: No intake/output data recorded.   LABORATORY DATA: Recent Labs    08/17/19 0621 08/18/19 0311  WBC  --  9.0  HGB  --  12.3*  HCT  --  35.7*  PLT  --  204  NA  --  140  K  --  4.7  CL  --  107  CO2  --  24  BUN  --  13  CREATININE  --  0.69  GLUCOSE  --  170*  INR 1.1  --   CALCIUM  --  8.4*    Examination: Neurologically intact ABD soft Neurovascular intact Sensation intact distally Intact pulses distally Dorsiflexion/Plantar flexion intact Incision: dressing C/D/I No cellulitis present Compartment soft}  Assessment:   1 Day Post-Op Procedure(s) (LRB): LEFT TOTAL KNEE ARTHROPLASTY (Left) ADDITIONAL DIAGNOSIS: Expected Acute Blood Loss Anemia, afib, ED  Patient's anticipated LOS is less than 2 midnights, meeting these requirements: - Younger than 52 - Lives within 1 hour of care - Has a competent adult at home to recover with post-op recover - NO history of  - Chronic pain requiring opiods  - Diabetes  - Coronary Artery Disease  - Heart  failure  - Heart attack  - Stroke  - DVT/VTE  - Cardiac arrhythmia  - Respiratory Failure/COPD  - Renal failure  - Anemia  - Advanced Liver disease       Plan: PT/OT WBAT, AROM and PROM  DVT Prophylaxis:  SCDx72hrs, ASA 81 mg BID x 2 weeks DISCHARGE PLAN: Home, today DISCHARGE NEEDS: HHPT, Walker and 3-in-1 comode seat     Kerin Salen 08/18/2019, 8:08 AM Patient ID: Bradley Roberson, male   DOB: April 06, 1941, 79 y.o.   MRN: [id number]

## 2019-08-20 DIAGNOSIS — Z96652 Presence of left artificial knee joint: Secondary | ICD-10-CM | POA: Diagnosis not present

## 2019-08-20 DIAGNOSIS — M6281 Muscle weakness (generalized): Secondary | ICD-10-CM | POA: Diagnosis not present

## 2019-08-20 DIAGNOSIS — M25662 Stiffness of left knee, not elsewhere classified: Secondary | ICD-10-CM | POA: Diagnosis not present

## 2019-08-21 DIAGNOSIS — M6281 Muscle weakness (generalized): Secondary | ICD-10-CM | POA: Diagnosis not present

## 2019-08-21 DIAGNOSIS — M25662 Stiffness of left knee, not elsewhere classified: Secondary | ICD-10-CM | POA: Diagnosis not present

## 2019-08-21 DIAGNOSIS — Z96652 Presence of left artificial knee joint: Secondary | ICD-10-CM | POA: Diagnosis not present

## 2019-08-24 DIAGNOSIS — M25662 Stiffness of left knee, not elsewhere classified: Secondary | ICD-10-CM | POA: Diagnosis not present

## 2019-08-24 DIAGNOSIS — Z96652 Presence of left artificial knee joint: Secondary | ICD-10-CM | POA: Diagnosis not present

## 2019-08-24 DIAGNOSIS — M6281 Muscle weakness (generalized): Secondary | ICD-10-CM | POA: Diagnosis not present

## 2019-08-26 DIAGNOSIS — Z96652 Presence of left artificial knee joint: Secondary | ICD-10-CM | POA: Diagnosis not present

## 2019-08-26 DIAGNOSIS — M25662 Stiffness of left knee, not elsewhere classified: Secondary | ICD-10-CM | POA: Diagnosis not present

## 2019-08-26 DIAGNOSIS — M6281 Muscle weakness (generalized): Secondary | ICD-10-CM | POA: Diagnosis not present

## 2019-08-27 DIAGNOSIS — Z96652 Presence of left artificial knee joint: Secondary | ICD-10-CM | POA: Diagnosis not present

## 2019-08-27 DIAGNOSIS — Z471 Aftercare following joint replacement surgery: Secondary | ICD-10-CM | POA: Diagnosis not present

## 2019-08-27 DIAGNOSIS — M25662 Stiffness of left knee, not elsewhere classified: Secondary | ICD-10-CM | POA: Diagnosis not present

## 2019-08-28 DIAGNOSIS — Z96652 Presence of left artificial knee joint: Secondary | ICD-10-CM | POA: Diagnosis not present

## 2019-08-28 DIAGNOSIS — M25662 Stiffness of left knee, not elsewhere classified: Secondary | ICD-10-CM | POA: Diagnosis not present

## 2019-08-28 DIAGNOSIS — M6281 Muscle weakness (generalized): Secondary | ICD-10-CM | POA: Diagnosis not present

## 2019-08-31 DIAGNOSIS — M25662 Stiffness of left knee, not elsewhere classified: Secondary | ICD-10-CM | POA: Diagnosis not present

## 2019-08-31 DIAGNOSIS — M6281 Muscle weakness (generalized): Secondary | ICD-10-CM | POA: Diagnosis not present

## 2019-08-31 DIAGNOSIS — Z96652 Presence of left artificial knee joint: Secondary | ICD-10-CM | POA: Diagnosis not present

## 2019-09-02 DIAGNOSIS — M25662 Stiffness of left knee, not elsewhere classified: Secondary | ICD-10-CM | POA: Diagnosis not present

## 2019-09-02 DIAGNOSIS — Z96652 Presence of left artificial knee joint: Secondary | ICD-10-CM | POA: Diagnosis not present

## 2019-09-02 DIAGNOSIS — M6281 Muscle weakness (generalized): Secondary | ICD-10-CM | POA: Diagnosis not present

## 2019-09-04 DIAGNOSIS — Z96652 Presence of left artificial knee joint: Secondary | ICD-10-CM | POA: Diagnosis not present

## 2019-09-04 DIAGNOSIS — M6281 Muscle weakness (generalized): Secondary | ICD-10-CM | POA: Diagnosis not present

## 2019-09-04 DIAGNOSIS — M25662 Stiffness of left knee, not elsewhere classified: Secondary | ICD-10-CM | POA: Diagnosis not present

## 2019-09-07 DIAGNOSIS — M25662 Stiffness of left knee, not elsewhere classified: Secondary | ICD-10-CM | POA: Diagnosis not present

## 2019-09-07 DIAGNOSIS — Z96652 Presence of left artificial knee joint: Secondary | ICD-10-CM | POA: Diagnosis not present

## 2019-09-07 DIAGNOSIS — M6281 Muscle weakness (generalized): Secondary | ICD-10-CM | POA: Diagnosis not present

## 2019-09-09 DIAGNOSIS — Z96652 Presence of left artificial knee joint: Secondary | ICD-10-CM | POA: Diagnosis not present

## 2019-09-09 DIAGNOSIS — M6281 Muscle weakness (generalized): Secondary | ICD-10-CM | POA: Diagnosis not present

## 2019-09-09 DIAGNOSIS — M25662 Stiffness of left knee, not elsewhere classified: Secondary | ICD-10-CM | POA: Diagnosis not present

## 2019-09-11 DIAGNOSIS — M6281 Muscle weakness (generalized): Secondary | ICD-10-CM | POA: Diagnosis not present

## 2019-09-11 DIAGNOSIS — Z96652 Presence of left artificial knee joint: Secondary | ICD-10-CM | POA: Diagnosis not present

## 2019-09-11 DIAGNOSIS — M25662 Stiffness of left knee, not elsewhere classified: Secondary | ICD-10-CM | POA: Diagnosis not present

## 2019-09-15 DIAGNOSIS — Z96652 Presence of left artificial knee joint: Secondary | ICD-10-CM | POA: Diagnosis not present

## 2019-09-15 DIAGNOSIS — M6281 Muscle weakness (generalized): Secondary | ICD-10-CM | POA: Diagnosis not present

## 2019-09-15 DIAGNOSIS — M25662 Stiffness of left knee, not elsewhere classified: Secondary | ICD-10-CM | POA: Diagnosis not present

## 2019-09-17 DIAGNOSIS — M25662 Stiffness of left knee, not elsewhere classified: Secondary | ICD-10-CM | POA: Diagnosis not present

## 2019-09-17 DIAGNOSIS — M6281 Muscle weakness (generalized): Secondary | ICD-10-CM | POA: Diagnosis not present

## 2019-09-17 DIAGNOSIS — Z96652 Presence of left artificial knee joint: Secondary | ICD-10-CM | POA: Diagnosis not present

## 2019-09-18 DIAGNOSIS — M6281 Muscle weakness (generalized): Secondary | ICD-10-CM | POA: Diagnosis not present

## 2019-09-18 DIAGNOSIS — M25662 Stiffness of left knee, not elsewhere classified: Secondary | ICD-10-CM | POA: Diagnosis not present

## 2019-09-18 DIAGNOSIS — Z96652 Presence of left artificial knee joint: Secondary | ICD-10-CM | POA: Diagnosis not present

## 2019-09-22 DIAGNOSIS — Z96652 Presence of left artificial knee joint: Secondary | ICD-10-CM | POA: Diagnosis not present

## 2019-09-22 DIAGNOSIS — M6281 Muscle weakness (generalized): Secondary | ICD-10-CM | POA: Diagnosis not present

## 2019-09-22 DIAGNOSIS — M25662 Stiffness of left knee, not elsewhere classified: Secondary | ICD-10-CM | POA: Diagnosis not present

## 2019-09-23 DIAGNOSIS — M6281 Muscle weakness (generalized): Secondary | ICD-10-CM | POA: Diagnosis not present

## 2019-09-23 DIAGNOSIS — M25662 Stiffness of left knee, not elsewhere classified: Secondary | ICD-10-CM | POA: Diagnosis not present

## 2019-09-23 DIAGNOSIS — Z96652 Presence of left artificial knee joint: Secondary | ICD-10-CM | POA: Diagnosis not present

## 2019-09-25 DIAGNOSIS — Z96652 Presence of left artificial knee joint: Secondary | ICD-10-CM | POA: Diagnosis not present

## 2019-09-25 DIAGNOSIS — M25662 Stiffness of left knee, not elsewhere classified: Secondary | ICD-10-CM | POA: Diagnosis not present

## 2019-09-25 DIAGNOSIS — M6281 Muscle weakness (generalized): Secondary | ICD-10-CM | POA: Diagnosis not present

## 2019-09-28 DIAGNOSIS — M25662 Stiffness of left knee, not elsewhere classified: Secondary | ICD-10-CM | POA: Diagnosis not present

## 2019-09-28 DIAGNOSIS — M6281 Muscle weakness (generalized): Secondary | ICD-10-CM | POA: Diagnosis not present

## 2019-09-28 DIAGNOSIS — Z96652 Presence of left artificial knee joint: Secondary | ICD-10-CM | POA: Diagnosis not present

## 2019-09-30 DIAGNOSIS — M25662 Stiffness of left knee, not elsewhere classified: Secondary | ICD-10-CM | POA: Diagnosis not present

## 2019-09-30 DIAGNOSIS — Z96652 Presence of left artificial knee joint: Secondary | ICD-10-CM | POA: Diagnosis not present

## 2019-09-30 DIAGNOSIS — M6281 Muscle weakness (generalized): Secondary | ICD-10-CM | POA: Diagnosis not present

## 2019-10-02 DIAGNOSIS — M25662 Stiffness of left knee, not elsewhere classified: Secondary | ICD-10-CM | POA: Diagnosis not present

## 2019-10-02 DIAGNOSIS — Z96652 Presence of left artificial knee joint: Secondary | ICD-10-CM | POA: Diagnosis not present

## 2019-10-02 DIAGNOSIS — M6281 Muscle weakness (generalized): Secondary | ICD-10-CM | POA: Diagnosis not present

## 2020-01-07 DIAGNOSIS — L570 Actinic keratosis: Secondary | ICD-10-CM | POA: Diagnosis not present

## 2020-01-07 DIAGNOSIS — L814 Other melanin hyperpigmentation: Secondary | ICD-10-CM | POA: Diagnosis not present

## 2020-01-07 DIAGNOSIS — L578 Other skin changes due to chronic exposure to nonionizing radiation: Secondary | ICD-10-CM | POA: Diagnosis not present

## 2020-01-07 DIAGNOSIS — L821 Other seborrheic keratosis: Secondary | ICD-10-CM | POA: Diagnosis not present

## 2020-01-07 DIAGNOSIS — Z85828 Personal history of other malignant neoplasm of skin: Secondary | ICD-10-CM | POA: Diagnosis not present

## 2020-01-07 DIAGNOSIS — D225 Melanocytic nevi of trunk: Secondary | ICD-10-CM | POA: Diagnosis not present

## 2020-02-02 DIAGNOSIS — Z96652 Presence of left artificial knee joint: Secondary | ICD-10-CM | POA: Diagnosis not present

## 2020-02-02 DIAGNOSIS — Z471 Aftercare following joint replacement surgery: Secondary | ICD-10-CM | POA: Diagnosis not present

## 2020-02-15 DIAGNOSIS — Z23 Encounter for immunization: Secondary | ICD-10-CM | POA: Diagnosis not present

## 2020-03-22 DIAGNOSIS — N3001 Acute cystitis with hematuria: Secondary | ICD-10-CM | POA: Diagnosis not present

## 2020-03-29 DIAGNOSIS — H524 Presbyopia: Secondary | ICD-10-CM | POA: Diagnosis not present

## 2020-03-29 DIAGNOSIS — Z961 Presence of intraocular lens: Secondary | ICD-10-CM | POA: Diagnosis not present

## 2020-04-13 DIAGNOSIS — N3001 Acute cystitis with hematuria: Secondary | ICD-10-CM | POA: Diagnosis not present

## 2020-04-21 DIAGNOSIS — Z23 Encounter for immunization: Secondary | ICD-10-CM | POA: Diagnosis not present

## 2020-05-04 DIAGNOSIS — N390 Urinary tract infection, site not specified: Secondary | ICD-10-CM | POA: Diagnosis not present

## 2020-08-02 DIAGNOSIS — Z96652 Presence of left artificial knee joint: Secondary | ICD-10-CM | POA: Diagnosis not present

## 2020-08-02 DIAGNOSIS — Z471 Aftercare following joint replacement surgery: Secondary | ICD-10-CM | POA: Diagnosis not present

## 2020-09-13 ENCOUNTER — Ambulatory Visit (INDEPENDENT_AMBULATORY_CARE_PROVIDER_SITE_OTHER): Payer: Medicare Other | Admitting: Cardiology

## 2020-09-13 ENCOUNTER — Other Ambulatory Visit: Payer: Self-pay

## 2020-09-13 ENCOUNTER — Encounter: Payer: Self-pay | Admitting: Cardiology

## 2020-09-13 VITALS — BP 134/90 | HR 72 | Ht 73.0 in | Wt 211.0 lb

## 2020-09-13 DIAGNOSIS — I4821 Permanent atrial fibrillation: Secondary | ICD-10-CM | POA: Diagnosis not present

## 2020-09-13 NOTE — Progress Notes (Signed)
Electrophysiology Office Note   Date:  09/13/2020   ID:  Bradley Roberson, DOB [birth year], MRN [id number]  PCP:  Gaynelle Arabian, MD  Primary Electrophysiologist:  Constance Haw, MD    No chief complaint on file.    History of Present Illness: Bradley Roberson is a 80 y.o. male who presents today for electrophysiology evaluation.  He has a history of atrial fibrillation and hypertension.  He was recently diagnosed with atrial fibrillation.  He was started on Xarelto by his primary physician.  He had an attempted cardioversion but was not successful.  He has been treated with rate control.  Today, denies symptoms of palpitations, chest pain, shortness of breath, orthopnea, PND, lower extremity edema, claudication, dizziness, presyncope, syncope, bleeding, or neurologic sequela. The patient is tolerating medications without difficulties.  He overall feels well.  He is able do all of his daily activities and is without restriction.  He had a knee replacement earlier this year and has recovered well.  He is working out on the elliptical and has not had any issues.  He states that his heart rates get into the 140s at times when he is working out.  He is not overly symptomatic when his heart rates are fast.  He states that his heart rates only go up when he is exercising.  Past Medical History:  Diagnosis Date  . Atrial fibrillation ([place]) 08/2015  . 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 Dysrhythmia    a-fib  . Erectile dysfunction   . Hypertension    Past Surgical History:  Procedure Laterality Date  . CARDIOVERSION N/A 11/10/2015   Procedure: CARDIOVERSION;  Surgeon: Larey Dresser, MD;  Location: [facility];  Service: Cardiovascular;  Laterality: N/A;  . CATARACT EXTRACTION W/ INTRAOCULAR LENS IMPLANT  2018  . COLONOSCOPY  2006  . KNEE SURGERY     x 5   3 left knee 2 right knee  . right anterior cruciate ligament tear    . TOTAL HIP ARTHROPLASTY Right  03/11/2017   Procedure: RIGHT TOTAL HIP ARTHROPLASTY ANTERIOR APPROACH;  Surgeon: [name], MD;  Location: [facility];  Service: Orthopedics;  Laterality: Right;  . TOTAL KNEE ARTHROPLASTY Left 08/17/2019   Procedure: LEFT TOTAL KNEE ARTHROPLASTY;  Surgeon: [name], MD;  Location: [facility];  Service: Orthopedics;  Laterality: Left;  Marland Kitchen VASECTOMY       Current Outpatient Medications  Medication Sig Dispense Refill  . amLODipine (NORVASC) 5 MG tablet Take 5 mg daily by mouth.    . benazepril (LOTENSIN) 40 MG tablet Take 1 tablet (40 mg total) by mouth daily. 90 tablet 3  . Cholecalciferol (VITAMIN D3) 50 MCG (2000 UT) TABS Take 2,000 Units by mouth daily.    . rivaroxaban (XARELTO) 10 MG TABS tablet Take 1 tablet (10 mg total) by mouth daily. Take 10 mg for 2 weeks and then restart normal 20 mg dose 30 tablet 0  . sildenafil (REVATIO) 20 MG tablet Take 20 mg by mouth daily as needed (sexual activity).      No current facility-administered medications for this visit.    Allergies:   Patient has no known allergies.   Social History:  The patient  reports that he has never smoked. He has never used smokeless tobacco. He reports current alcohol use of about 3.0 standard drinks of alcohol per week. He reports that he does not use drugs.   Family History:  The patient's  family history includes Cirrhosis in his father; Hemochromatosis in his father; Hypertension in his mother; Liver cancer in his father.   ROS:  Please see the history of present illness.   Otherwise, review of systems is positive for none.   All other systems are reviewed and negative.   PHYSICAL EXAM: VS:  BP 134/90   Pulse 72   Ht 6\' 1"  (1.854 m)   Wt 211 lb (95.7 kg)   SpO2 97%   BMI 27.84 kg/m  , BMI Body mass index is 27.84 kg/m. GEN: Well nourished, well developed, in no acute distress  HEENT: normal  Neck: no JVD, carotid bruits, or masses Cardiac: Irregular; no murmurs, rubs, or gallops,no edema  Respiratory:   clear to auscultation bilaterally, normal work of breathing GI: soft, nontender, nondistended, + BS MS: no deformity or atrophy  Skin: warm and dry Neuro:  Strength and sensation are intact Psych: euthymic mood, full affect  EKG:  EKG is ordered today. Personal review of the ekg ordered shows atrial fibrillation, rate 72  Recent Labs: No results found for requested labs within last 8760 hours.    Lipid Panel  No results found for: CHOL, TRIG, HDL, CHOLHDL, VLDL, LDLCALC, LDLDIRECT   Wt Readings from Last 3 Encounters:  09/13/20 211 lb (95.7 kg)  08/17/19 209 lb 7 oz (95 kg)  08/04/19 211 lb 2 oz (95.8 kg)      Other studies Reviewed: Additional studies/ records that were reviewed today include: TTE 09/28/15 - Left ventricle: The cavity size was severely dilated. There was  mild focal basal hypertrophy of the septum. Systolic function was  mildly reduced. The estimated ejection fraction was in the range  of 45% to 50%. Diffuse hypokinesis. - Aortic valve: Trileaflet; moderately thickened, moderately  calcified leaflets. There was mild regurgitation. - Mitral valve: Calcified annulus. There was trivial regurgitation. - Left atrium: The appendage was severely dilated. - Right ventricle: The cavity size was mildly dilated. Wall  thickness was normal. Systolic function was normal. - Right atrium: The appendage was severely dilated. - Atrial septum: No defect or patent foramen ovale was identified  by color flow Doppler. - Tricuspid valve: There was mild regurgitation. - Pulmonary arteries: Systolic pressure was within the normal  range. PA peak pressure: 34 mm Hg (S). - Inferior vena cava: The vessel was dilated. The respirophasic  diameter changes were in the normal range (>= 50%), consistent  with elevated central venous pressure.  Impressions:  - Systolic function may be underestimated due to atrial  fibrillation.   ASSESSMENT AND PLAN:  1.  Permanent  atrial fibrillation: Currently on Xarelto.  CHA2DS2-VASc of 3.  Has been minimally symptomatic from his atrial fibrillation.  He is well rate controlled.  We Bradley Roberson continue with current management.   2.  Hypertension: Currently well controlled  Current medicines are reviewed at length with the patient today.   The patient does not have concerns regarding his medicines.  The following changes were made today: none  Labs/ tests ordered today include:  Orders Placed This Encounter  Procedures  . EKG 12-Lead     Disposition:   FU with Bradley Roberson 12 months   Signed, Brunetta Newingham Meredith Leeds, MD  09/13/2020 11:01 AM     Bradley Roberson [address] [place] [address] [phone] (office) [phone] (fax)

## 2020-11-03 DIAGNOSIS — N529 Male erectile dysfunction, unspecified: Secondary | ICD-10-CM | POA: Diagnosis not present

## 2020-11-03 DIAGNOSIS — Z0000 Encounter for general adult medical examination without abnormal findings: Secondary | ICD-10-CM | POA: Diagnosis not present

## 2020-11-03 DIAGNOSIS — I10 Essential (primary) hypertension: Secondary | ICD-10-CM | POA: Diagnosis not present

## 2020-11-03 DIAGNOSIS — Z1389 Encounter for screening for other disorder: Secondary | ICD-10-CM | POA: Diagnosis not present

## 2020-11-03 DIAGNOSIS — M179 Osteoarthritis of knee, unspecified: Secondary | ICD-10-CM | POA: Diagnosis not present

## 2020-11-03 DIAGNOSIS — I4821 Permanent atrial fibrillation: Secondary | ICD-10-CM | POA: Diagnosis not present

## 2020-11-03 DIAGNOSIS — D692 Other nonthrombocytopenic purpura: Secondary | ICD-10-CM | POA: Diagnosis not present

## 2021-01-10 DIAGNOSIS — L814 Other melanin hyperpigmentation: Secondary | ICD-10-CM | POA: Diagnosis not present

## 2021-01-10 DIAGNOSIS — L821 Other seborrheic keratosis: Secondary | ICD-10-CM | POA: Diagnosis not present

## 2021-01-10 DIAGNOSIS — L570 Actinic keratosis: Secondary | ICD-10-CM | POA: Diagnosis not present

## 2021-01-10 DIAGNOSIS — D225 Melanocytic nevi of trunk: Secondary | ICD-10-CM | POA: Diagnosis not present

## 2021-01-10 DIAGNOSIS — Z23 Encounter for immunization: Secondary | ICD-10-CM | POA: Diagnosis not present

## 2021-01-10 DIAGNOSIS — L578 Other skin changes due to chronic exposure to nonionizing radiation: Secondary | ICD-10-CM | POA: Diagnosis not present

## 2021-01-10 DIAGNOSIS — Z85828 Personal history of other malignant neoplasm of skin: Secondary | ICD-10-CM | POA: Diagnosis not present

## 2021-01-30 DIAGNOSIS — Z23 Encounter for immunization: Secondary | ICD-10-CM | POA: Diagnosis not present

## 2021-04-18 DIAGNOSIS — H26493 Other secondary cataract, bilateral: Secondary | ICD-10-CM | POA: Diagnosis not present

## 2021-09-01 NOTE — Progress Notes (Signed)
PCP:  Gaynelle Arabian, MD Primary Cardiologist: None Electrophysiologist: Will Meredith Leeds, MD   Bradley Roberson is a 81 y.o. male seen today for Will Meredith Leeds, MD for routine electrophysiology followup.  Since last being seen in our clinic the patient reports doing very well.  he denies chest pain, palpitations, dyspnea, PND, orthopnea, nausea, vomiting, dizziness, syncope, edema, weight gain, or early satiety.  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   Dysrhythmia    a-fib   Erectile dysfunction    Hypertension    Past Surgical History:  Procedure Laterality Date   CARDIOVERSION N/A 11/10/2015   Procedure: CARDIOVERSION;  Surgeon: Larey Dresser, MD;  Location: [facility];  Service: Cardiovascular;  Laterality: N/A;   CATARACT EXTRACTION W/ INTRAOCULAR LENS IMPLANT  2018   COLONOSCOPY  2006   KNEE SURGERY     x 5   3 left knee 2 right knee   right anterior cruciate ligament tear     TOTAL HIP ARTHROPLASTY Right 03/11/2017   Procedure: RIGHT TOTAL HIP ARTHROPLASTY ANTERIOR APPROACH;  Surgeon: [name], MD;  Location: [facility];  Service: Orthopedics;  Laterality: Right;   TOTAL KNEE ARTHROPLASTY Left 08/17/2019   Procedure: LEFT TOTAL KNEE ARTHROPLASTY;  Surgeon: [name], MD;  [facility];  Service: Orthopedics;  Laterality: Left;   VASECTOMY      Current Outpatient Medications  Medication Sig Dispense Refill   amLODipine (NORVASC) 5 MG tablet Take 5 mg daily by mouth.     benazepril (LOTENSIN) 40 MG tablet Take 1 tablet (40 mg total) by mouth daily. 90 tablet 3   cholecalciferol (VITAMIN D) 25 MCG (1000 UNIT) tablet Take 1,000 Units by mouth daily.     rivaroxaban (XARELTO) 20 MG TABS tablet Take 20 mg by mouth daily with supper.     sildenafil (REVATIO) 20 MG tablet Take 20 mg by mouth daily as needed (sexual activity).      No current facility-administered  medications for this visit.    No Known Allergies  Social History   Socioeconomic History   Marital status: Married    Spouse name: Not on file   Number of children: Not on file   Years of education: Not on file   Highest education level: Not on file  Occupational History   Not on file  Tobacco Use   Smoking status: Never   Smokeless tobacco: Never  Vaping Use   Vaping Use: Never used  Substance and Sexual Activity   Alcohol use: Yes    Alcohol/week: 3.0 standard drinks    Types: 3 Cans of beer per week    Comment: occasional   Drug use: No   Sexual activity: Not Currently  Other Topics Concern   Not on file  Social History Narrative   Not on file   Social Determinants of Health   Financial Resource Strain: Not on file  Food Insecurity: Not on file  Transportation Needs: Not on file  Physical Activity: Not on file  Stress: Not on file  Social Connections: Not on file  Intimate Partner Violence: Not on file     Review of Systems: All other systems reviewed and are otherwise negative except as noted above.  Physical Exam: Vitals:   09/08/21 0922  BP: (!) 128/92  Pulse: 69  SpO2: 98%  Weight: 210 lb (95.3 kg)  Height: '6\' 1"'$  (  1.854 m)    GEN- The patient is well appearing, alert and oriented x 3 today.   HEENT: normocephalic, atraumatic; sclera clear, conjunctiva pink; hearing intact; oropharynx clear; neck supple, no JVP Lymph- no cervical lymphadenopathy Lungs- Clear to ausculation bilaterally, normal work of breathing.  No wheezes, rales, rhonchi Heart- Regular rate and rhythm, rubs or gallops, PMI not laterally displaced. 2-3/6 SEM GI- soft, non-tender, non-distended, bowel sounds present, no hepatosplenomegaly Extremities- no clubbing, cyanosis, or edema; DP/PT/radial pulses 2+ bilaterally MS- no significant deformity or atrophy Skin- warm and dry, no rash or lesion Psych- euthymic mood, full affect Neuro- strength and sensation are intact  EKG is  ordered. Personal review of EKG from today shows AF with controlled rate at 65 bpm and a brief offset pause  Additional studies reviewed include: Previous EP office notes.   Assessment and Plan:  1. Permanent atrial fibrillation On Xarelto for CHA2DS2-VASc of 3 Minimally symptomatic Update echo as below.   2. HTN Stable on current regimen   3. Heart murmur ? Aortic with a 2-3/6 SEM. Update echocardiogram.  Follow up with Dr. Curt Bears in 12 months; sooner pending echo.    Shirley Friar, PA-C  09/08/21 9:34 AM

## 2021-09-08 ENCOUNTER — Ambulatory Visit (INDEPENDENT_AMBULATORY_CARE_PROVIDER_SITE_OTHER): Payer: Medicare Other | Admitting: Student

## 2021-09-08 ENCOUNTER — Encounter: Payer: Self-pay | Admitting: Student

## 2021-09-08 VITALS — BP 128/92 | HR 69 | Ht 73.0 in | Wt 210.0 lb

## 2021-09-08 DIAGNOSIS — I4821 Permanent atrial fibrillation: Secondary | ICD-10-CM | POA: Diagnosis not present

## 2021-09-08 DIAGNOSIS — I10 Essential (primary) hypertension: Secondary | ICD-10-CM

## 2021-09-08 NOTE — Patient Instructions (Signed)
Medication Instructions:  Your physician recommends that you continue on your current medications as directed. Please refer to the Current Medication list given to you today.  *If you need a refill on your cardiac medications before your next appointment, please call your pharmacy*   Lab Work: None If you have labs (blood work) drawn today and your tests are completely normal, you will receive your results only by: [place] (if you have MyChart) OR A paper copy in the mail If you have any lab test that is abnormal or we need to change your treatment, we will call you to review the results.   Testing/Procedures: Your physician has requested that you have an echocardiogram. Echocardiography is a painless test that uses sound waves to create images of your heart. It provides your doctor with information about the size and shape of your heart and how well your heart's chambers and valves are working. This procedure takes approximately one hour. There are no restrictions for this procedure.   Follow-Up: At [facility], you and your health needs are our priority.  As part of our continuing mission to provide you with exceptional heart care, we have created designated Provider Care Teams.  These Care Teams include your primary Cardiologist (physician) and Advanced Practice Providers (APPs -  Physician Assistants and Nurse Practitioners) who all work together to provide you with the care you need, when you need it.  We recommend signing up for the patient portal called "MyChart".  Sign up information is provided on this After Visit Summary.  MyChart is used to connect with patients for Virtual Visits (Telemedicine).  Patients are able to view lab/test results, encounter notes, upcoming appointments, etc.  Non-urgent messages can be sent to your provider as well.   To learn more about what you can do with MyChart, go to [email or web address].    Your next appointment:   1 year(s)  The  format for your next appointment:   In Person  Provider:   Allegra Lai, MD{

## 2021-09-20 DIAGNOSIS — S51819A Laceration without foreign body of unspecified forearm, initial encounter: Secondary | ICD-10-CM | POA: Diagnosis not present

## 2021-09-22 DIAGNOSIS — S51819D Laceration without foreign body of unspecified forearm, subsequent encounter: Secondary | ICD-10-CM | POA: Diagnosis not present

## 2021-09-27 ENCOUNTER — Ambulatory Visit (HOSPITAL_COMMUNITY): Payer: Medicare Other | Attending: Internal Medicine

## 2021-09-27 DIAGNOSIS — I4821 Permanent atrial fibrillation: Secondary | ICD-10-CM | POA: Insufficient documentation

## 2021-09-27 LAB — ECHOCARDIOGRAM COMPLETE
AR max vel: 1.59 cm2
AV Area VTI: 1.45 cm2
AV Area mean vel: 1.39 cm2
AV Mean grad: 15 mmHg
AV Peak grad: 25.9 mmHg
Ao pk vel: 2.55 m/s
P 1/2 time: 464 msec
S' Lateral: 3.8 cm

## 2021-09-29 DIAGNOSIS — S51819D Laceration without foreign body of unspecified forearm, subsequent encounter: Secondary | ICD-10-CM | POA: Diagnosis not present

## 2021-10-15 DIAGNOSIS — R3989 Other symptoms and signs involving the genitourinary system: Secondary | ICD-10-CM | POA: Diagnosis not present

## 2021-10-15 DIAGNOSIS — N3000 Acute cystitis without hematuria: Secondary | ICD-10-CM | POA: Diagnosis not present

## 2021-11-01 DIAGNOSIS — R350 Frequency of micturition: Secondary | ICD-10-CM | POA: Diagnosis not present

## 2021-12-07 DIAGNOSIS — M179 Osteoarthritis of knee, unspecified: Secondary | ICD-10-CM | POA: Diagnosis not present

## 2021-12-07 DIAGNOSIS — I4821 Permanent atrial fibrillation: Secondary | ICD-10-CM | POA: Diagnosis not present

## 2021-12-07 DIAGNOSIS — I340 Nonrheumatic mitral (valve) insufficiency: Secondary | ICD-10-CM | POA: Diagnosis not present

## 2021-12-07 DIAGNOSIS — N529 Male erectile dysfunction, unspecified: Secondary | ICD-10-CM | POA: Diagnosis not present

## 2021-12-07 DIAGNOSIS — D692 Other nonthrombocytopenic purpura: Secondary | ICD-10-CM | POA: Diagnosis not present

## 2021-12-07 DIAGNOSIS — Z1331 Encounter for screening for depression: Secondary | ICD-10-CM | POA: Diagnosis not present

## 2021-12-07 DIAGNOSIS — I10 Essential (primary) hypertension: Secondary | ICD-10-CM | POA: Diagnosis not present

## 2021-12-07 DIAGNOSIS — Z0000 Encounter for general adult medical examination without abnormal findings: Secondary | ICD-10-CM | POA: Diagnosis not present

## 2022-01-19 DIAGNOSIS — Z23 Encounter for immunization: Secondary | ICD-10-CM | POA: Diagnosis not present

## 2022-01-22 DIAGNOSIS — Z85828 Personal history of other malignant neoplasm of skin: Secondary | ICD-10-CM | POA: Diagnosis not present

## 2022-01-22 DIAGNOSIS — L578 Other skin changes due to chronic exposure to nonionizing radiation: Secondary | ICD-10-CM | POA: Diagnosis not present

## 2022-01-22 DIAGNOSIS — D225 Melanocytic nevi of trunk: Secondary | ICD-10-CM | POA: Diagnosis not present

## 2022-01-22 DIAGNOSIS — L570 Actinic keratosis: Secondary | ICD-10-CM | POA: Diagnosis not present

## 2022-01-22 DIAGNOSIS — L814 Other melanin hyperpigmentation: Secondary | ICD-10-CM | POA: Diagnosis not present

## 2022-01-22 DIAGNOSIS — L821 Other seborrheic keratosis: Secondary | ICD-10-CM | POA: Diagnosis not present

## 2022-04-24 DIAGNOSIS — H26493 Other secondary cataract, bilateral: Secondary | ICD-10-CM | POA: Diagnosis not present

## 2022-04-24 DIAGNOSIS — Z961 Presence of intraocular lens: Secondary | ICD-10-CM | POA: Diagnosis not present

## 2022-08-28 ENCOUNTER — Encounter: Payer: Self-pay | Admitting: General Practice

## 2022-09-12 ENCOUNTER — Ambulatory Visit: Payer: Medicare Other | Admitting: Cardiology

## 2022-09-26 ENCOUNTER — Encounter: Payer: Self-pay | Admitting: Cardiology

## 2022-09-26 ENCOUNTER — Ambulatory Visit: Payer: Medicare Other | Attending: Cardiology | Admitting: Cardiology

## 2022-09-26 VITALS — BP 124/82 | HR 86 | Ht 74.0 in | Wt 203.0 lb

## 2022-09-26 DIAGNOSIS — I4821 Permanent atrial fibrillation: Secondary | ICD-10-CM | POA: Insufficient documentation

## 2022-09-26 DIAGNOSIS — I10 Essential (primary) hypertension: Secondary | ICD-10-CM | POA: Insufficient documentation

## 2022-09-26 DIAGNOSIS — D6869 Other thrombophilia: Secondary | ICD-10-CM | POA: Diagnosis not present

## 2022-09-26 NOTE — Progress Notes (Signed)
  Electrophysiology Office Note:   Date:  09/26/2022  ID:  Bradley Roberson, DOB [birth year], MRN [id number]  Primary Cardiologist: None Electrophysiologist: Lucious Zou [name], MD      History of Present Illness:   Bradley Roberson is a 82 y.o. male with h/o atrial fibrillation and mild heart failure seen today for routine electrophysiology followup.  He had a recent echo that showed a normalization of his cardiac function. Since last being seen in our clinic the patient reports doing well.  He has no acute complaints.  He continues to be able to do all of his daily activities without restriction..  he denies chest pain, palpitations, dyspnea, PND, orthopnea, nausea, vomiting, dizziness, syncope, edema, weight gain, or early satiety.   Review of systems complete and found to be negative unless listed in HPI.   Studies Reviewed:    EKG is ordered today. Personal review shows sinus rhythm  Risk Assessment/Calculations:    CHA2DS2-VASc Score = 4   This indicates a 4.8% annual risk of stroke. The patient's score is based upon: CHF History: 1 HTN History: 1 Diabetes History: 0 Stroke History: 0 Vascular Disease History: 0 Age Score: 2 Gender Score: 0             Physical Exam:   VS:  BP 124/82   Pulse 86   Ht 6\' 2"  (1.88 m)   Wt 203 lb (92.1 kg)   SpO2 99%   BMI 26.06 kg/m    Wt Readings from Last 3 Encounters:  09/26/22 203 lb (92.1 kg)  09/08/21 210 lb (95.3 kg)  09/13/20 211 lb (95.7 kg)     GEN: Well nourished, well developed in no acute distress NECK: No JVD; No carotid bruits CARDIAC: Irregularly irregular rate and rhythm, no murmurs, rubs, gallops RESPIRATORY:  Clear to auscultation without rales, wheezing or rhonchi  ABDOMEN: Soft, non-tender, non-distended EXTREMITIES:  No edema; No deformity   ASSESSMENT AND PLAN:    1.  Permanent atrial fibrillation: Currently on Xarelto.  CHA2DS2-VASc of 2.  Minimally symptomatic.  Continue with rate control.  2.   Hypertension: Currently well-controlled  3.  Secondary hypercoagulable state: Currently on Xarelto for atrial fibrillation   Follow up with EP APP in 3 months  Signed, Ahtziry Saathoff [name], MD

## 2022-09-26 NOTE — Patient Instructions (Addendum)
Medication Instructions:  Your physician recommends that you continue on your current medications as directed. Please refer to the Current Medication list given to you today.  *If you need a refill on your cardiac medications before your next appointment, please call your pharmacy*   Lab Work: None ordered   Testing/Procedures: None ordered   Follow-Up: At [facility], you and your health needs are our priority.  As part of our continuing mission to provide you with exceptional heart care, we have created designated Provider Care Teams.  These Care Teams include your primary Cardiologist (physician) and Advanced Practice Providers (APPs -  Physician Assistants and Nurse Practitioners) who all work together to provide you with the care you need, when you need it.  We recommend signing up for the patient portal called "MyChart".  Sign up information is provided on this After Visit Summary.  MyChart is used to connect with patients for Virtual Visits (Telemedicine).  Patients are able to view lab/test results, encounter notes, upcoming appointments, etc.  Non-urgent messages can be sent to your provider as well.   To learn more about what you can do with MyChart, go to [email or web address].    Your next appointment:   1 year  The format for your next appointment:   In Person  Provider:   You will see one of the following Advanced Practice Providers on your designated Care Team:   Francis Dowse, [place] Casimiro Needle "Mardelle Matte" Lanna Poche, [place]   Thank you for choosing [facility]!!   Dory Horn, RN [phone]

## 2022-11-05 ENCOUNTER — Telehealth: Payer: Self-pay | Admitting: Cardiology

## 2022-11-05 ENCOUNTER — Other Ambulatory Visit: Payer: Self-pay | Admitting: *Deleted

## 2022-11-05 DIAGNOSIS — Z01818 Encounter for other preprocedural examination: Secondary | ICD-10-CM

## 2022-11-05 NOTE — Telephone Encounter (Signed)
   Pre-operative Risk Assessment    Patient Name: Bradley Roberson  DOB: 19-May-1940 MRN: [id number]     Request for Surgical Clearance    Procedure:   Apicoectomy   Date of Surgery:  Clearance TBD                                 Surgeon:  Dr. Stark Jock  Surgeon's Group or Practice Name:  [facility] Phone number:  [phone] Fax number:  [phone]   Type of Clearance Requested:   - Pharmacy:  Hold Rivaroxaban (Xarelto) 3-5 day hold    Type of Anesthesia:  Local    Additional requests/questions:    Signed, April Henson   11/05/2022, 1:08 PM

## 2022-11-05 NOTE — Telephone Encounter (Signed)
Patient has no record of labs since 2021.  Please get updated CBC, BMET for clearance

## 2022-11-05 NOTE — Telephone Encounter (Signed)
PATIENT SCHEDULED FOR LABS ON 11-06-22

## 2022-11-06 ENCOUNTER — Ambulatory Visit: Payer: Medicare Other | Attending: Cardiology

## 2022-11-06 DIAGNOSIS — Z01818 Encounter for other preprocedural examination: Secondary | ICD-10-CM | POA: Diagnosis not present

## 2022-11-07 LAB — BASIC METABOLIC PANEL
BUN/Creatinine Ratio: 25 — ABNORMAL HIGH (ref 10–24)
BUN: 19 mg/dL (ref 8–27)
CO2: 26 mmol/L (ref 20–29)
Calcium: 8.9 mg/dL (ref 8.6–10.2)
Chloride: 105 mmol/L (ref 96–106)
Creatinine, Ser: 0.77 mg/dL (ref 0.76–1.27)
Glucose: 81 mg/dL (ref 70–99)
Potassium: 4.3 mmol/L (ref 3.5–5.2)
Sodium: 142 mmol/L (ref 134–144)
eGFR: 90 mL/min/{1.73_m2} (ref >59)

## 2022-11-07 LAB — CBC
Hematocrit: 39.1 % (ref 37.5–51.0)
Hemoglobin: 13.4 g/dL (ref 13.0–17.7)
MCH: 32.3 pg (ref 26.6–33.0)
MCHC: 34.3 g/dL (ref 31.5–35.7)
MCV: 94 fL (ref 79–97)
Platelets: 228 10*3/uL (ref 150–450)
RBC: 4.15 x10E6/uL (ref 4.14–5.80)
RDW: 12.9 % (ref 11.6–15.4)
WBC: 4.8 10*3/uL (ref 3.4–10.8)

## 2022-11-07 NOTE — Telephone Encounter (Signed)
Patient with diagnosis of atrial fibrillation on Xarelto for anticoagulation.    Procedure: apicoectomy Date of procedure: TBD   CHA2DS2-VASc Score = 4   This indicates a 4.8% annual risk of stroke. The patient's score is based upon: CHF History: 1 HTN History: 1 Diabetes History: 0 Stroke History: 0 Vascular Disease History: 0 Age Score: 2 Gender Score: 0    CrCl 98 Platelet count 228  Patient does not require pre-op antibiotics for dental procedure.  Per office protocol, patient can hold Xarelto for 3 days prior to procedure.   Patient will not need bridging with Lovenox (enoxaparin) around procedure.  **This guidance is not considered finalized until pre-operative APP has relayed final recommendations.**

## 2022-11-07 NOTE — Telephone Encounter (Signed)
   Primary Cardiologist: None  CHA2DS2-VASc Score = 4   This indicates a 4.8% annual risk of stroke. The patient's score is based upon: CHF History: 1 HTN History: 1 Diabetes History: 0 Stroke History: 0 Vascular Disease History: 0 Age Score: 2 Gender Score: 0     CrCl 98 Platelet count 228   Patient does not require pre-op antibiotics for dental procedure.   Per office protocol, patient can hold Xarelto for 3 days prior to procedure.   Patient will not need bridging with Lovenox (enoxaparin) around procedure.  I will route this recommendation to the requesting party via Epic fax function and remove from pre-op pool.  Please call with questions.  Levi Aland, NP-C  11/07/2022, 4:40 PM [address] Office [phone] Fax [phone]

## 2022-12-04 DIAGNOSIS — K048 Radicular cyst: Secondary | ICD-10-CM | POA: Diagnosis not present

## 2022-12-12 DIAGNOSIS — Z0000 Encounter for general adult medical examination without abnormal findings: Secondary | ICD-10-CM | POA: Diagnosis not present

## 2022-12-12 DIAGNOSIS — I10 Essential (primary) hypertension: Secondary | ICD-10-CM | POA: Diagnosis not present

## 2022-12-12 DIAGNOSIS — Z1331 Encounter for screening for depression: Secondary | ICD-10-CM | POA: Diagnosis not present

## 2022-12-12 DIAGNOSIS — I340 Nonrheumatic mitral (valve) insufficiency: Secondary | ICD-10-CM | POA: Diagnosis not present

## 2022-12-12 DIAGNOSIS — I4821 Permanent atrial fibrillation: Secondary | ICD-10-CM | POA: Diagnosis not present

## 2022-12-12 DIAGNOSIS — N529 Male erectile dysfunction, unspecified: Secondary | ICD-10-CM | POA: Diagnosis not present

## 2023-01-01 DIAGNOSIS — Z23 Encounter for immunization: Secondary | ICD-10-CM | POA: Diagnosis not present

## 2023-02-11 DIAGNOSIS — L570 Actinic keratosis: Secondary | ICD-10-CM | POA: Diagnosis not present

## 2023-02-11 DIAGNOSIS — D485 Neoplasm of uncertain behavior of skin: Secondary | ICD-10-CM | POA: Diagnosis not present

## 2023-02-11 DIAGNOSIS — L578 Other skin changes due to chronic exposure to nonionizing radiation: Secondary | ICD-10-CM | POA: Diagnosis not present

## 2023-02-11 DIAGNOSIS — Z85828 Personal history of other malignant neoplasm of skin: Secondary | ICD-10-CM | POA: Diagnosis not present

## 2023-02-11 DIAGNOSIS — L821 Other seborrheic keratosis: Secondary | ICD-10-CM | POA: Diagnosis not present

## 2023-02-11 DIAGNOSIS — D225 Melanocytic nevi of trunk: Secondary | ICD-10-CM | POA: Diagnosis not present

## 2023-02-11 DIAGNOSIS — L814 Other melanin hyperpigmentation: Secondary | ICD-10-CM | POA: Diagnosis not present

## 2023-02-17 DIAGNOSIS — N390 Urinary tract infection, site not specified: Secondary | ICD-10-CM | POA: Diagnosis not present

## 2023-03-11 DIAGNOSIS — R3989 Other symptoms and signs involving the genitourinary system: Secondary | ICD-10-CM | POA: Diagnosis not present

## 2023-04-09 DIAGNOSIS — R350 Frequency of micturition: Secondary | ICD-10-CM | POA: Diagnosis not present

## 2023-04-09 DIAGNOSIS — J988 Other specified respiratory disorders: Secondary | ICD-10-CM | POA: Diagnosis not present

## 2023-04-30 DIAGNOSIS — H33302 Unspecified retinal break, left eye: Secondary | ICD-10-CM | POA: Diagnosis not present

## 2023-04-30 DIAGNOSIS — Z961 Presence of intraocular lens: Secondary | ICD-10-CM | POA: Diagnosis not present

## 2023-05-29 DIAGNOSIS — R309 Painful micturition, unspecified: Secondary | ICD-10-CM | POA: Diagnosis not present

## 2023-05-29 DIAGNOSIS — N390 Urinary tract infection, site not specified: Secondary | ICD-10-CM | POA: Diagnosis not present

## 2023-06-10 DIAGNOSIS — R350 Frequency of micturition: Secondary | ICD-10-CM | POA: Diagnosis not present

## 2023-06-10 DIAGNOSIS — N390 Urinary tract infection, site not specified: Secondary | ICD-10-CM | POA: Diagnosis not present

## 2023-06-11 DIAGNOSIS — R350 Frequency of micturition: Secondary | ICD-10-CM | POA: Diagnosis not present

## 2023-06-11 DIAGNOSIS — R351 Nocturia: Secondary | ICD-10-CM | POA: Diagnosis not present

## 2023-06-11 DIAGNOSIS — N3020 Other chronic cystitis without hematuria: Secondary | ICD-10-CM | POA: Diagnosis not present

## 2023-06-11 DIAGNOSIS — N401 Enlarged prostate with lower urinary tract symptoms: Secondary | ICD-10-CM | POA: Diagnosis not present

## 2023-07-03 DIAGNOSIS — N5201 Erectile dysfunction due to arterial insufficiency: Secondary | ICD-10-CM | POA: Diagnosis not present

## 2023-07-03 DIAGNOSIS — R350 Frequency of micturition: Secondary | ICD-10-CM | POA: Diagnosis not present

## 2023-07-03 DIAGNOSIS — N401 Enlarged prostate with lower urinary tract symptoms: Secondary | ICD-10-CM | POA: Diagnosis not present

## 2023-07-03 DIAGNOSIS — R351 Nocturia: Secondary | ICD-10-CM | POA: Diagnosis not present

## 2023-07-03 DIAGNOSIS — N3020 Other chronic cystitis without hematuria: Secondary | ICD-10-CM | POA: Diagnosis not present

## 2023-07-18 DIAGNOSIS — R2681 Unsteadiness on feet: Secondary | ICD-10-CM | POA: Diagnosis not present

## 2023-08-12 DIAGNOSIS — R2681 Unsteadiness on feet: Secondary | ICD-10-CM | POA: Diagnosis not present

## 2023-08-19 DIAGNOSIS — R2681 Unsteadiness on feet: Secondary | ICD-10-CM | POA: Diagnosis not present

## 2023-08-27 DIAGNOSIS — R2681 Unsteadiness on feet: Secondary | ICD-10-CM | POA: Diagnosis not present

## 2023-08-30 DIAGNOSIS — R2681 Unsteadiness on feet: Secondary | ICD-10-CM | POA: Diagnosis not present

## 2023-09-07 DIAGNOSIS — H1033 Unspecified acute conjunctivitis, bilateral: Secondary | ICD-10-CM | POA: Diagnosis not present

## 2023-09-11 DIAGNOSIS — R2681 Unsteadiness on feet: Secondary | ICD-10-CM | POA: Diagnosis not present

## 2023-09-16 DIAGNOSIS — Z961 Presence of intraocular lens: Secondary | ICD-10-CM | POA: Diagnosis not present

## 2023-09-16 DIAGNOSIS — H5213 Myopia, bilateral: Secondary | ICD-10-CM | POA: Diagnosis not present

## 2023-09-16 DIAGNOSIS — H26493 Other secondary cataract, bilateral: Secondary | ICD-10-CM | POA: Diagnosis not present

## 2023-09-18 DIAGNOSIS — R2681 Unsteadiness on feet: Secondary | ICD-10-CM | POA: Diagnosis not present

## 2023-09-24 DIAGNOSIS — R2681 Unsteadiness on feet: Secondary | ICD-10-CM | POA: Diagnosis not present

## 2023-09-25 DIAGNOSIS — H26492 Other secondary cataract, left eye: Secondary | ICD-10-CM | POA: Diagnosis not present

## 2023-10-01 DIAGNOSIS — R2681 Unsteadiness on feet: Secondary | ICD-10-CM | POA: Diagnosis not present

## 2023-10-02 ENCOUNTER — Ambulatory Visit: Payer: Medicare Other | Admitting: Cardiology

## 2023-10-07 DIAGNOSIS — R2681 Unsteadiness on feet: Secondary | ICD-10-CM | POA: Diagnosis not present

## 2023-10-14 DIAGNOSIS — R2681 Unsteadiness on feet: Secondary | ICD-10-CM | POA: Diagnosis not present

## 2023-10-21 DIAGNOSIS — R2681 Unsteadiness on feet: Secondary | ICD-10-CM | POA: Diagnosis not present

## 2023-10-24 NOTE — Progress Notes (Signed)
  Electrophysiology Office Note:   Date:  10/25/2023  ID:  Bradley Roberson, DOB February 14, 1941, MRN [id number]  Primary Cardiologist: None Primary Heart Failure: None Electrophysiologist: Anisia Leija Gladis Norton, MD      History of Present Illness:   Bradley Roberson is a 83 y.o. male with h/o atrial fibrillation, mild systolic heart failure seen today for routine electrophysiology followup.   Since last being seen in our clinic the patient reports doing well.  He has no chest pain or shortness of breath.  He is able to do all of his daily activities without restriction.  He has no acute complaints at this time.  he denies chest pain, palpitations, dyspnea, PND, orthopnea, nausea, vomiting, dizziness, syncope, edema, weight gain, or early satiety.   Review of systems complete and found to be negative unless listed in HPI.   EP Information / Studies Reviewed:    EKG is ordered today. Personal review as below.        Risk Assessment/Calculations:    CHA2DS2-VASc Score = 3   This indicates a 3.2% annual risk of stroke. The patient's score is based upon: CHF History: 0 HTN History: 1 Diabetes History: 0 Stroke History: 0 Vascular Disease History: 0 Age Score: 2 Gender Score: 0            Physical Exam:   VS:  BP 127/76 (BP Location: Left Arm, Patient Position: Sitting, Cuff Size: Large)   Pulse 65   Ht 6' 2 (1.88 m)   Wt 204 lb (92.5 kg)   SpO2 97%   BMI 26.19 kg/m    Wt Readings from Last 3 Encounters:  10/25/23 204 lb (92.5 kg)  09/26/22 203 lb (92.1 kg)  09/08/21 210 lb (95.3 kg)     GEN: Well nourished, well developed in no acute distress NECK: No JVD; No carotid bruits CARDIAC: Irregularly irregular rate and rhythm, 2/6 systolic murmur RESPIRATORY:  Clear to auscultation without rales, wheezing or rhonchi  ABDOMEN: Soft, non-tender, non-distended EXTREMITIES:  No edema; No deformity   ASSESSMENT AND PLAN:    1.  Permanent atrial fibrillation: On Xarelto .  Minimally  symptomatic.  Continue rate control  2.  Hypertension:well controlled  3.  Secondary hypercoagulable state: On Xarelto   4.  Mild aortic stenosis: Patient is minimally symptomatic.  Bradley Roberson continue with current management.  Bradley Roberson need repeat echo in 2028.  Follow up with [facility] in 12 months  Signed, Tommy Goostree Gladis Norton, MD

## 2023-10-25 ENCOUNTER — Encounter: Payer: Self-pay | Admitting: Cardiology

## 2023-10-25 ENCOUNTER — Ambulatory Visit: Attending: Cardiology | Admitting: Cardiology

## 2023-10-25 VITALS — BP 127/76 | HR 65 | Ht 74.0 in | Wt 204.0 lb

## 2023-10-25 DIAGNOSIS — I10 Essential (primary) hypertension: Secondary | ICD-10-CM | POA: Insufficient documentation

## 2023-10-25 DIAGNOSIS — D6869 Other thrombophilia: Secondary | ICD-10-CM | POA: Insufficient documentation

## 2023-10-25 DIAGNOSIS — I350 Nonrheumatic aortic (valve) stenosis: Secondary | ICD-10-CM | POA: Insufficient documentation

## 2023-10-25 DIAGNOSIS — I4821 Permanent atrial fibrillation: Secondary | ICD-10-CM | POA: Insufficient documentation

## 2023-10-25 NOTE — Patient Instructions (Signed)
  Follow-Up: At [facility], you and your health needs are our priority.  As part of our continuing mission to provide you with exceptional heart care, our providers are all part of one team.  This team includes your primary Cardiologist (physician) and Advanced Practice Providers or APPs (Physician Assistants and Nurse Practitioners) who all work together to provide you with the care you need, when you need it.  Your next appointment:   1 year(s)  Provider:   You will follow up in the At[facility] Fibrillation Clinic located at [facility]. Your provider will be: Clint R. Fenton, PA-C or Fairy Heinrich, PA-C    We recommend signing up for the patient portal called MyChart.  Sign up information is provided on this After Visit Summary.  MyChart is used to connect with patients for Virtual Visits (Telemedicine).  Patients are able to view lab/test results, encounter notes, upcoming appointments, etc.  Non-urgent messages can be sent to your provider as well.   To learn more about what you can do with MyChart, go to [email or web address].

## 2023-10-28 DIAGNOSIS — R2681 Unsteadiness on feet: Secondary | ICD-10-CM | POA: Diagnosis not present

## 2023-10-31 DIAGNOSIS — R2681 Unsteadiness on feet: Secondary | ICD-10-CM | POA: Diagnosis not present

## 2023-11-04 DIAGNOSIS — R2681 Unsteadiness on feet: Secondary | ICD-10-CM | POA: Diagnosis not present

## 2023-11-06 DIAGNOSIS — N401 Enlarged prostate with lower urinary tract symptoms: Secondary | ICD-10-CM | POA: Diagnosis not present

## 2023-11-06 DIAGNOSIS — R351 Nocturia: Secondary | ICD-10-CM | POA: Diagnosis not present

## 2023-11-06 DIAGNOSIS — N3020 Other chronic cystitis without hematuria: Secondary | ICD-10-CM | POA: Diagnosis not present

## 2023-11-06 DIAGNOSIS — R350 Frequency of micturition: Secondary | ICD-10-CM | POA: Diagnosis not present

## 2023-11-06 DIAGNOSIS — N5201 Erectile dysfunction due to arterial insufficiency: Secondary | ICD-10-CM | POA: Diagnosis not present

## 2023-11-11 DIAGNOSIS — R2681 Unsteadiness on feet: Secondary | ICD-10-CM | POA: Diagnosis not present

## 2023-11-18 DIAGNOSIS — R2681 Unsteadiness on feet: Secondary | ICD-10-CM | POA: Diagnosis not present

## 2023-11-21 DIAGNOSIS — R2681 Unsteadiness on feet: Secondary | ICD-10-CM | POA: Diagnosis not present

## 2023-11-25 DIAGNOSIS — R2681 Unsteadiness on feet: Secondary | ICD-10-CM | POA: Diagnosis not present

## 2023-11-29 DIAGNOSIS — R2681 Unsteadiness on feet: Secondary | ICD-10-CM | POA: Diagnosis not present

## 2023-12-06 DIAGNOSIS — R2681 Unsteadiness on feet: Secondary | ICD-10-CM | POA: Diagnosis not present

## 2023-12-09 DIAGNOSIS — R2681 Unsteadiness on feet: Secondary | ICD-10-CM | POA: Diagnosis not present

## 2023-12-12 DIAGNOSIS — R2681 Unsteadiness on feet: Secondary | ICD-10-CM | POA: Diagnosis not present

## 2023-12-18 DIAGNOSIS — R2681 Unsteadiness on feet: Secondary | ICD-10-CM | POA: Diagnosis not present

## 2023-12-20 DIAGNOSIS — R2681 Unsteadiness on feet: Secondary | ICD-10-CM | POA: Diagnosis not present

## 2023-12-23 DIAGNOSIS — I340 Nonrheumatic mitral (valve) insufficiency: Secondary | ICD-10-CM | POA: Diagnosis not present

## 2023-12-23 DIAGNOSIS — I10 Essential (primary) hypertension: Secondary | ICD-10-CM | POA: Diagnosis not present

## 2023-12-23 DIAGNOSIS — I4821 Permanent atrial fibrillation: Secondary | ICD-10-CM | POA: Diagnosis not present

## 2023-12-23 DIAGNOSIS — Z0000 Encounter for general adult medical examination without abnormal findings: Secondary | ICD-10-CM | POA: Diagnosis not present

## 2023-12-24 DIAGNOSIS — R2681 Unsteadiness on feet: Secondary | ICD-10-CM | POA: Diagnosis not present

## 2023-12-26 DIAGNOSIS — R2681 Unsteadiness on feet: Secondary | ICD-10-CM | POA: Diagnosis not present

## 2023-12-30 DIAGNOSIS — R2681 Unsteadiness on feet: Secondary | ICD-10-CM | POA: Diagnosis not present

## 2024-01-01 DIAGNOSIS — R2681 Unsteadiness on feet: Secondary | ICD-10-CM | POA: Diagnosis not present

## 2024-01-06 DIAGNOSIS — R2681 Unsteadiness on feet: Secondary | ICD-10-CM | POA: Diagnosis not present

## 2024-01-08 DIAGNOSIS — R2681 Unsteadiness on feet: Secondary | ICD-10-CM | POA: Diagnosis not present

## 2024-01-17 DIAGNOSIS — R2681 Unsteadiness on feet: Secondary | ICD-10-CM | POA: Diagnosis not present

## 2024-01-20 DIAGNOSIS — R2681 Unsteadiness on feet: Secondary | ICD-10-CM | POA: Diagnosis not present

## 2024-01-22 DIAGNOSIS — R2681 Unsteadiness on feet: Secondary | ICD-10-CM | POA: Diagnosis not present

## 2024-02-05 DIAGNOSIS — R2681 Unsteadiness on feet: Secondary | ICD-10-CM | POA: Diagnosis not present

## 2024-02-12 DIAGNOSIS — R2681 Unsteadiness on feet: Secondary | ICD-10-CM | POA: Diagnosis not present

## 2024-02-18 DIAGNOSIS — R2681 Unsteadiness on feet: Secondary | ICD-10-CM | POA: Diagnosis not present

## 2024-02-21 DIAGNOSIS — R2681 Unsteadiness on feet: Secondary | ICD-10-CM | POA: Diagnosis not present

## 2024-02-25 DIAGNOSIS — R2681 Unsteadiness on feet: Secondary | ICD-10-CM | POA: Diagnosis not present

## 2024-02-27 DIAGNOSIS — R2681 Unsteadiness on feet: Secondary | ICD-10-CM | POA: Diagnosis not present

## 2024-03-03 DIAGNOSIS — R2681 Unsteadiness on feet: Secondary | ICD-10-CM | POA: Diagnosis not present

## 2024-03-04 DIAGNOSIS — D0439 Carcinoma in situ of skin of other parts of face: Secondary | ICD-10-CM | POA: Diagnosis not present

## 2024-03-04 DIAGNOSIS — C44311 Basal cell carcinoma of skin of nose: Secondary | ICD-10-CM | POA: Diagnosis not present

## 2024-03-05 DIAGNOSIS — R2681 Unsteadiness on feet: Secondary | ICD-10-CM | POA: Diagnosis not present

## 2024-03-09 DIAGNOSIS — R2681 Unsteadiness on feet: Secondary | ICD-10-CM | POA: Diagnosis not present

## 2024-03-10 DIAGNOSIS — R2681 Unsteadiness on feet: Secondary | ICD-10-CM | POA: Diagnosis not present

## 2024-03-16 DIAGNOSIS — R2681 Unsteadiness on feet: Secondary | ICD-10-CM | POA: Diagnosis not present

## 2024-03-20 DIAGNOSIS — R2681 Unsteadiness on feet: Secondary | ICD-10-CM | POA: Diagnosis not present

## 2024-03-23 DIAGNOSIS — R2681 Unsteadiness on feet: Secondary | ICD-10-CM | POA: Diagnosis not present

## 2024-03-26 DIAGNOSIS — R2681 Unsteadiness on feet: Secondary | ICD-10-CM | POA: Diagnosis not present

## 2024-03-30 DIAGNOSIS — R2681 Unsteadiness on feet: Secondary | ICD-10-CM | POA: Diagnosis not present

## 2024-04-02 DIAGNOSIS — R2681 Unsteadiness on feet: Secondary | ICD-10-CM | POA: Diagnosis not present
